# Patient Record
Sex: Female | Born: 1970 | Race: White | Hispanic: Yes | Marital: Married | Smoking: Never smoker
Health system: Southern US, Community
[De-identification: ages and names within clinical notes are randomized; demographics above are authoritative.]

## PROBLEM LIST (undated history)

## (undated) ENCOUNTER — Inpatient Hospital Stay (HOSPITAL_COMMUNITY): Payer: Self-pay

## (undated) DIAGNOSIS — R12 Heartburn: Secondary | ICD-10-CM

## (undated) DIAGNOSIS — I10 Essential (primary) hypertension: Secondary | ICD-10-CM

## (undated) DIAGNOSIS — O26899 Other specified pregnancy related conditions, unspecified trimester: Secondary | ICD-10-CM

## (undated) DIAGNOSIS — E785 Hyperlipidemia, unspecified: Secondary | ICD-10-CM

## (undated) DIAGNOSIS — D649 Anemia, unspecified: Secondary | ICD-10-CM

## (undated) HISTORY — PX: TUBAL LIGATION: SHX77

---

## 2006-10-09 ENCOUNTER — Encounter (INDEPENDENT_AMBULATORY_CARE_PROVIDER_SITE_OTHER): Payer: Self-pay | Admitting: Gynecology

## 2006-10-09 ENCOUNTER — Inpatient Hospital Stay (HOSPITAL_COMMUNITY): Admission: AD | Admit: 2006-10-09 | Discharge: 2006-10-09 | Payer: Self-pay | Admitting: Gynecology

## 2006-10-16 ENCOUNTER — Inpatient Hospital Stay (HOSPITAL_COMMUNITY): Admission: AD | Admit: 2006-10-16 | Discharge: 2006-10-16 | Payer: Self-pay | Admitting: Obstetrics & Gynecology

## 2007-08-14 ENCOUNTER — Inpatient Hospital Stay (HOSPITAL_COMMUNITY): Admission: AD | Admit: 2007-08-14 | Discharge: 2007-08-14 | Payer: Self-pay | Admitting: Obstetrics & Gynecology

## 2007-09-26 ENCOUNTER — Encounter: Admission: RE | Admit: 2007-09-26 | Discharge: 2007-12-25 | Payer: Self-pay | Admitting: Obstetrics & Gynecology

## 2007-09-26 ENCOUNTER — Ambulatory Visit: Payer: Self-pay | Admitting: *Deleted

## 2007-09-29 ENCOUNTER — Ambulatory Visit: Payer: Self-pay | Admitting: Obstetrics and Gynecology

## 2007-10-03 ENCOUNTER — Ambulatory Visit: Payer: Self-pay | Admitting: Family Medicine

## 2007-10-10 ENCOUNTER — Ambulatory Visit: Payer: Self-pay | Admitting: Obstetrics & Gynecology

## 2007-10-13 ENCOUNTER — Ambulatory Visit (HOSPITAL_COMMUNITY): Admission: RE | Admit: 2007-10-13 | Discharge: 2007-10-13 | Payer: Self-pay | Admitting: Family Medicine

## 2007-10-17 ENCOUNTER — Ambulatory Visit: Payer: Self-pay | Admitting: Obstetrics & Gynecology

## 2007-10-27 ENCOUNTER — Ambulatory Visit (HOSPITAL_COMMUNITY): Admission: RE | Admit: 2007-10-27 | Discharge: 2007-10-27 | Payer: Self-pay | Admitting: Obstetrics & Gynecology

## 2007-10-31 ENCOUNTER — Ambulatory Visit: Payer: Self-pay | Admitting: *Deleted

## 2007-11-07 ENCOUNTER — Ambulatory Visit: Payer: Self-pay | Admitting: *Deleted

## 2007-11-14 ENCOUNTER — Ambulatory Visit: Payer: Self-pay | Admitting: Family Medicine

## 2007-11-28 ENCOUNTER — Ambulatory Visit: Payer: Self-pay | Admitting: Obstetrics & Gynecology

## 2007-12-12 ENCOUNTER — Ambulatory Visit: Payer: Self-pay | Admitting: Obstetrics & Gynecology

## 2007-12-14 ENCOUNTER — Ambulatory Visit (HOSPITAL_COMMUNITY): Admission: RE | Admit: 2007-12-14 | Discharge: 2007-12-14 | Payer: Self-pay | Admitting: Family Medicine

## 2007-12-15 ENCOUNTER — Ambulatory Visit: Payer: Self-pay | Admitting: Obstetrics & Gynecology

## 2007-12-26 ENCOUNTER — Ambulatory Visit: Payer: Self-pay | Admitting: Obstetrics & Gynecology

## 2008-01-02 ENCOUNTER — Ambulatory Visit: Payer: Self-pay | Admitting: Obstetrics & Gynecology

## 2008-01-11 ENCOUNTER — Inpatient Hospital Stay (HOSPITAL_COMMUNITY): Admission: AD | Admit: 2008-01-11 | Discharge: 2008-01-11 | Payer: Self-pay | Admitting: Obstetrics & Gynecology

## 2008-01-16 ENCOUNTER — Ambulatory Visit (HOSPITAL_COMMUNITY): Admission: RE | Admit: 2008-01-16 | Discharge: 2008-01-16 | Payer: Self-pay | Admitting: Obstetrics and Gynecology

## 2008-01-16 ENCOUNTER — Ambulatory Visit: Payer: Self-pay | Admitting: Obstetrics & Gynecology

## 2008-01-23 ENCOUNTER — Ambulatory Visit: Payer: Self-pay | Admitting: Obstetrics & Gynecology

## 2008-01-26 ENCOUNTER — Ambulatory Visit: Payer: Self-pay | Admitting: Obstetrics & Gynecology

## 2008-01-30 ENCOUNTER — Ambulatory Visit: Payer: Self-pay | Admitting: Family Medicine

## 2008-02-02 ENCOUNTER — Ambulatory Visit: Payer: Self-pay | Admitting: Obstetrics & Gynecology

## 2008-02-02 ENCOUNTER — Ambulatory Visit (HOSPITAL_COMMUNITY): Admission: RE | Admit: 2008-02-02 | Discharge: 2008-02-02 | Payer: Self-pay | Admitting: Obstetrics and Gynecology

## 2008-02-06 ENCOUNTER — Ambulatory Visit: Payer: Self-pay | Admitting: Obstetrics & Gynecology

## 2008-02-06 ENCOUNTER — Ambulatory Visit (HOSPITAL_COMMUNITY): Admission: RE | Admit: 2008-02-06 | Discharge: 2008-02-06 | Payer: Self-pay | Admitting: Obstetrics & Gynecology

## 2008-02-09 ENCOUNTER — Ambulatory Visit: Payer: Self-pay | Admitting: Family Medicine

## 2008-02-13 ENCOUNTER — Ambulatory Visit: Payer: Self-pay | Admitting: Obstetrics & Gynecology

## 2008-02-13 ENCOUNTER — Encounter: Payer: Self-pay | Admitting: Obstetrics & Gynecology

## 2008-02-15 ENCOUNTER — Ambulatory Visit: Payer: Self-pay | Admitting: Obstetrics and Gynecology

## 2008-02-15 ENCOUNTER — Inpatient Hospital Stay (HOSPITAL_COMMUNITY): Admission: AD | Admit: 2008-02-15 | Discharge: 2008-02-19 | Payer: Self-pay | Admitting: Family Medicine

## 2008-02-16 ENCOUNTER — Encounter: Payer: Self-pay | Admitting: Obstetrics and Gynecology

## 2008-04-13 LAB — CONVERTED CEMR LAB

## 2008-07-12 ENCOUNTER — Ambulatory Visit: Payer: Self-pay | Admitting: Nurse Practitioner

## 2008-07-12 DIAGNOSIS — I10 Essential (primary) hypertension: Secondary | ICD-10-CM | POA: Insufficient documentation

## 2008-07-12 DIAGNOSIS — E119 Type 2 diabetes mellitus without complications: Secondary | ICD-10-CM

## 2008-07-12 DIAGNOSIS — E1100 Type 2 diabetes mellitus with hyperosmolarity without nonketotic hyperglycemic-hyperosmolar coma (NKHHC): Secondary | ICD-10-CM | POA: Insufficient documentation

## 2008-07-12 LAB — CONVERTED CEMR LAB
Bilirubin Urine: NEGATIVE
Hgb A1c MFr Bld: 7.8 %
Nitrite: NEGATIVE
Specific Gravity, Urine: >=1.03
WBC Urine, dipstick: NEGATIVE

## 2008-07-19 ENCOUNTER — Ambulatory Visit: Payer: Self-pay | Admitting: *Deleted

## 2008-08-09 ENCOUNTER — Ambulatory Visit: Payer: Self-pay | Admitting: Nurse Practitioner

## 2008-08-09 LAB — CONVERTED CEMR LAB
Bilirubin Urine: NEGATIVE
Ketones, urine, test strip: NEGATIVE
Specific Gravity, Urine: 1.015

## 2008-08-10 ENCOUNTER — Encounter (INDEPENDENT_AMBULATORY_CARE_PROVIDER_SITE_OTHER): Payer: Self-pay | Admitting: Nurse Practitioner

## 2008-08-10 LAB — CONVERTED CEMR LAB
ALT: 11 U/L (ref 0–35)
AST: 11 U/L (ref 0–37)
Albumin: 3.9 g/dL (ref 3.5–5.2)
Alkaline Phosphatase: 54 U/L (ref 39–117)
BUN: 13 mg/dL (ref 6–23)
Basophils Absolute: 0 K/uL (ref 0.0–0.1)
Basophils Relative: 1 % (ref 0–1)
CO2: 23 meq/L (ref 19–32)
Calcium: 8.5 mg/dL (ref 8.4–10.5)
Chloride: 105 meq/L (ref 96–112)
Cholesterol: 169 mg/dL (ref 0–200)
Creatinine, Ser: 0.68 mg/dL (ref 0.40–1.20)
Eosinophils Absolute: 0.8 K/uL — ABNORMAL HIGH (ref 0.0–0.7)
Eosinophils Relative: 9 % — ABNORMAL HIGH (ref 0–5)
Glucose, Bld: 105 mg/dL — ABNORMAL HIGH (ref 70–99)
HCT: 43.3 % (ref 36.0–46.0)
HDL: 54 mg/dL (ref >39)
Hemoglobin: 14 g/dL (ref 12.0–15.0)
LDL Cholesterol: 94 mg/dL (ref 0–99)
Lymphocytes Relative: 31 % (ref 12–46)
Lymphs Abs: 2.7 K/uL (ref 0.7–4.0)
MCHC: 32.3 g/dL (ref 30.0–36.0)
MCV: 91.9 fL (ref 78.0–100.0)
Microalb, Ur: 0.84 mg/dL (ref 0.00–1.89)
Monocytes Absolute: 0.3 K/uL (ref 0.1–1.0)
Monocytes Relative: 3 % (ref 3–12)
Neutro Abs: 4.9 K/uL (ref 1.7–7.7)
Neutrophils Relative %: 57 % (ref 43–77)
Platelets: 303 K/uL (ref 150–400)
Potassium: 4.8 meq/L (ref 3.5–5.3)
RBC: 4.71 M/uL (ref 3.87–5.11)
RDW: 12.8 % (ref 11.5–15.5)
Sodium: 138 meq/L (ref 135–145)
TSH: 0.815 u[IU]/mL (ref 0.350–4.500)
Total Bilirubin: 0.5 mg/dL (ref 0.3–1.2)
Total CHOL/HDL Ratio: 3.1
Total Protein: 7.6 g/dL (ref 6.0–8.3)
Triglycerides: 106 mg/dL (ref <150)
VLDL: 21 mg/dL (ref 0–40)
WBC: 8.7 10*3/microliter (ref 4.0–10.5)

## 2008-10-10 ENCOUNTER — Ambulatory Visit: Payer: Self-pay | Admitting: Nurse Practitioner

## 2008-10-10 DIAGNOSIS — E7800 Pure hypercholesterolemia, unspecified: Secondary | ICD-10-CM | POA: Insufficient documentation

## 2008-10-10 LAB — CONVERTED CEMR LAB
Blood Glucose, Fingerstick: 126
LDL Goal: 100 mg/dL

## 2008-10-31 ENCOUNTER — Ambulatory Visit: Payer: Self-pay | Admitting: Nurse Practitioner

## 2008-10-31 LAB — CONVERTED CEMR LAB
HDL: 54 mg/dL (ref >39)
Total CHOL/HDL Ratio: 2.7

## 2008-11-05 ENCOUNTER — Encounter (INDEPENDENT_AMBULATORY_CARE_PROVIDER_SITE_OTHER): Payer: Self-pay | Admitting: Nurse Practitioner

## 2009-01-10 ENCOUNTER — Ambulatory Visit: Payer: Self-pay | Admitting: Internal Medicine

## 2009-01-10 ENCOUNTER — Ambulatory Visit: Payer: Self-pay | Admitting: Nurse Practitioner

## 2009-01-10 LAB — CONVERTED CEMR LAB
Blood Glucose, Fingerstick: 111
Hgb A1c MFr Bld: 6.7 %

## 2009-02-04 ENCOUNTER — Encounter (INDEPENDENT_AMBULATORY_CARE_PROVIDER_SITE_OTHER): Payer: Self-pay | Admitting: Nurse Practitioner

## 2009-06-06 ENCOUNTER — Ambulatory Visit: Payer: Self-pay | Admitting: Nurse Practitioner

## 2009-06-06 LAB — CONVERTED CEMR LAB: Blood Glucose, Fingerstick: 135

## 2009-07-31 ENCOUNTER — Ambulatory Visit: Payer: Self-pay | Admitting: Nurse Practitioner

## 2009-07-31 DIAGNOSIS — K644 Residual hemorrhoidal skin tags: Secondary | ICD-10-CM | POA: Insufficient documentation

## 2009-07-31 LAB — CONVERTED CEMR LAB
ALT: 11 units/L (ref 0–35)
Albumin: 4 g/dL (ref 3.5–5.2)
Basophils Relative: 0 % (ref 0–1)
Bilirubin Urine: NEGATIVE
CO2: 22 meq/L (ref 19–32)
Casts: NONE SEEN /lpf
Chlamydia, DNA Probe: NEGATIVE
Chloride: 104 meq/L (ref 96–112)
Crystals: NONE SEEN
Eosinophils Absolute: 0.7 10*3/uL (ref 0.0–0.7)
Eosinophils Relative: 8 % — ABNORMAL HIGH (ref 0–5)
GC Probe Amp, Genital: NEGATIVE
Glucose, Urine, Semiquant: NEGATIVE
HCT: 41.4 % (ref 36.0–46.0)
Hemoglobin: 13.2 g/dL (ref 12.0–15.0)
Ketones, urine, test strip: NEGATIVE
Lymphs Abs: 2.7 10*3/uL (ref 0.7–4.0)
MCHC: 31.9 g/dL (ref 30.0–36.0)
MCV: 90.8 fL (ref 78.0–100.0)
Nitrite: NEGATIVE
RBC: 4.56 M/uL (ref 3.87–5.11)
RDW: 13.9 % (ref 11.5–15.5)
Sodium: 137 meq/L (ref 135–145)
Specific Gravity, Urine: 1.01
TSH: 1.1 microintl units/mL (ref 0.350–4.500)
WBC Urine, dipstick: NEGATIVE

## 2009-08-01 ENCOUNTER — Encounter (INDEPENDENT_AMBULATORY_CARE_PROVIDER_SITE_OTHER): Payer: Self-pay | Admitting: Nurse Practitioner

## 2009-08-19 ENCOUNTER — Encounter (INDEPENDENT_AMBULATORY_CARE_PROVIDER_SITE_OTHER): Payer: Self-pay | Admitting: Nurse Practitioner

## 2009-10-24 ENCOUNTER — Ambulatory Visit: Payer: Self-pay | Admitting: Nurse Practitioner

## 2009-10-28 ENCOUNTER — Encounter (INDEPENDENT_AMBULATORY_CARE_PROVIDER_SITE_OTHER): Payer: Self-pay | Admitting: Nurse Practitioner

## 2009-11-27 ENCOUNTER — Ambulatory Visit: Payer: Self-pay | Admitting: Nurse Practitioner

## 2009-11-27 DIAGNOSIS — R05 Cough: Secondary | ICD-10-CM

## 2009-11-27 LAB — CONVERTED CEMR LAB: Blood Glucose, Fingerstick: 216

## 2009-12-04 ENCOUNTER — Ambulatory Visit: Payer: Self-pay | Admitting: Nurse Practitioner

## 2009-12-04 DIAGNOSIS — K219 Gastro-esophageal reflux disease without esophagitis: Secondary | ICD-10-CM

## 2009-12-04 DIAGNOSIS — E669 Obesity, unspecified: Secondary | ICD-10-CM | POA: Insufficient documentation

## 2009-12-25 ENCOUNTER — Ambulatory Visit: Payer: Self-pay | Admitting: Nurse Practitioner

## 2009-12-25 DIAGNOSIS — J309 Allergic rhinitis, unspecified: Secondary | ICD-10-CM | POA: Insufficient documentation

## 2009-12-25 LAB — CONVERTED CEMR LAB: Blood Glucose, Fingerstick: 131

## 2010-01-06 ENCOUNTER — Encounter (INDEPENDENT_AMBULATORY_CARE_PROVIDER_SITE_OTHER): Payer: Self-pay | Admitting: *Deleted

## 2010-03-26 ENCOUNTER — Encounter (INDEPENDENT_AMBULATORY_CARE_PROVIDER_SITE_OTHER): Payer: Self-pay | Admitting: Nurse Practitioner

## 2010-03-26 ENCOUNTER — Ambulatory Visit: Payer: Self-pay | Admitting: Nurse Practitioner

## 2010-03-26 DIAGNOSIS — J00 Acute nasopharyngitis [common cold]: Secondary | ICD-10-CM

## 2010-03-26 LAB — CONVERTED CEMR LAB: Blood Glucose, Fingerstick: 145

## 2010-03-27 LAB — CONVERTED CEMR LAB: Hgb A1c MFr Bld: 7.5 % — ABNORMAL HIGH (ref <5.7)

## 2010-05-04 ENCOUNTER — Encounter: Payer: Self-pay | Admitting: *Deleted

## 2010-05-14 NOTE — Assessment & Plan Note (Signed)
Summary: Complete Physical Exam   Vital Signs:  Patient profile:   40 year old female Menstrual status:  regular LMP:     07/03/2009 Weight:      214.3 pounds BMI:     38.10 BSA:     1.99 Temp:     97.9 degrees F oral Pulse rate:   77 / minute Pulse rhythm:   regular Resp:     20 per minute BP sitting:   118 / 80  (left arm) Cuff size:   large  Vitals Entered By: Levon Hedger (July 31, 2009 10:21 AM) CC: CPP Is Patient Diabetic? Yes Pain Assessment Patient in pain? no      CBG Result 109 CBG Device ID B  Does patient need assistance? Functional Status Self care Ambulation Normal LMP (date): 07/03/2009 LMP - Character: normal     Menstrual Status regular Enter LMP: 07/03/2009 Last PAP Result done at [facility]   CC:  CPP.  History of Present Illness:  Pt into the office for complete physcial exam  PAP - all previous pap smears have been normal no family history of cervical or ovarian cancer 3 children - 1 C-section, last child is 10  and others are ages 20 and 76 Pt is not married but has been in the same relationship for the past 20 years Menses - monthly due to oral contraception but she has not taken it in 1 month because her menses came on when it was time for her CPe and she completed her supply  Mammogram - Never had a mammogram  no self breast exams at home No family history of breast cancer  Optho - "I am supposed to wear glasses"  during her last exam she was given an Rx but she was never able to get the glasses.  Pt is also diabetic and knows she should get yearly eye exams  Dental - no recent dental exam. last 2-3 years ago  Diabetes Management History:      The patient is a 40 years old female who comes in for evaluation of Type 2 Diabetes Mellitus.  She has not been enrolled in the "Diabetic Education Program".  She is checking home blood sugars.  She says that she is exercising.    Habits & Providers  Alcohol-Tobacco-Diet     Alcohol  drinks/day: 0     Tobacco Status: never  Exercise-Depression-Behavior     Does Patient Exercise: yes     Exercise Counseling: to improve exercise regimen     Have you felt down or hopeless? no     Have you felt little pleasure in things? no     Depression Counseling: not indicated; screening negative for depression     Drug Use: never  Comments: PHQ- 9 score = 2  Allergies (verified): No Known Drug Allergies  Review of Systems General:  Denies fever. Eyes:  Denies blurring. ENT:  Denies earache. CV:  Denies chest pain or discomfort. Resp:  Denies cough. GI:  Denies abdominal pain, nausea, and vomiting. GU:  Denies discharge. MS:  Denies joint pain. Derm:  Denies rash. Neuro:  Denies headaches. Psych:  Denies depression.  Physical Exam  General:  alert.   Head:  normocephalic.   Eyes:  pupils equal, pupils round, and pupils reactive to light.   Ears:  bil TM with moderate cerumen Nose:  no nasal discharge.   Mouth:  pharynx pink and moist and fair dentition.   Neck:  supple.   Chest  Wall:  no mass.   Breasts:  skin/areolae normal, no masses, and no abnormal thickening.   Lungs:  normal breath sounds.   Heart:  normal rate and regular rhythm.   Abdomen:  soft, non-tender, and no distention.   Rectal:  external hemorrhoid(s).   Msk:  normal ROM.   Extremities:  no edema Neurologic:  alert & oriented X3.    Pelvic Exam  Vulva:      normal appearance.   Urethra and Bladder:      Urethra--no discharge.  Bladder--normal.   Vagina:      yellow discharge Cervix:      parous, friable.  right deviation Uterus:      smooth.   Adnexa:      nontender bilaterally.   Rectum:      + external hemorrhoids.      Impression & Recommendations:  Problem # 1:  ROUTINE GYNECOLOGICAL EXAMINATION (ICD-V72.31) labs done except lipids as pt is not fasting EKG done rec optho and dental exam foot care placcard given self breast exam placcard given PHQ-9 score =  2 Orders: UA Dipstick w/o Micro (manual) (16109) KOH/ WET Mount [phone]) Pap Smear, Thin Prep ( Collection of) (Q0091) T- GC Chlamydia (09811)  Problem # 2:  HYPERCHOLESTEROLEMIA (ICD-272.0) labs done today - pt is not fasting Her updated medication list for this problem includes:    Pravastatin Sodium 10 Mg Tabs (Pravastatin sodium) .Marland Kitchen... 1 tablet by mouth nightly for cholesterol  Problem # 3:  FAMILY PLANNING (ICD-V25.09) urine pregnancy negative will refill oral contraception  Complete Medication List: 1)  Prenatal Multivitamin-ultra Tabs (Prenatal vit-dss-fe cbn-fa) .Marland Kitchen.. 1 tablet by mouth daily 2)  Glucophage 500 Mg Tabs (Metformin hcl) .Marland Kitchen.. 1 tablet by mouth two times a day for diabetes 3)  Cyclessa Tabs (Desogestrel-ethinyl estradiol) .... One tablet by mouth daily 4)  Prodigy Blood Glucose Monitor Devi (Blood glucose monitoring suppl) .... Dispense meter to check blood sugar dx - 250.02 5)  Prodigy Blood Glucose Test Strp (Glucose blood) .... Use to check blood sugar once daily dx = 250.02 6)  Prodigy Twist Top Lancets 28g Misc (Lancets) .... Use to check blood sugar daily before breakfast 7)  Pravastatin Sodium 10 Mg Tabs (Pravastatin sodium) .Marland Kitchen.. 1 tablet by mouth nightly for cholesterol 8)  Lisinopril 5 Mg Tabs (Lisinopril) .... One tablet by mouth daily for blood pressure  Other Orders: EKG w/ Interpretation (93000) T-Comprehensive Metabolic Panel (91478-29562) T-CBC w/Diff (13086-57846) Rapid HIV  (96295) T-TSH (28413-24401) Capillary Blood Glucose/CBG (02725) T-Urine Microalbumin w/creat. ratio [phone])  Diabetes Management Assessment/Plan:      The following lipid goals have been established for the patient: Total cholesterol goal of 200; LDL cholesterol goal of 100; HDL cholesterol goal of 40; Triglyceride goal of 150.  Her blood pressure goal is < 130/80.    Patient Instructions: 1)  You will be informed of any abnormal lab results. 2)  Birth control -  start the Sunday following your next menstrual cycle 3)  Follow up in 4 months for diabetes - August 2011 4)  Come fasting for cholesterol lab.  You may only drink water before this visit Prescriptions: CYCLESSA  TABS (DESOGESTREL-ETHINYL ESTRADIOL) One tablet by mouth daily  #1 package x 11   Entered and Authorized by:   Lehman Prom FNP   Signed by:   Lehman Prom FNP on 07/31/2009   Method used:   Print then Give to Patient   RxID:   [id number]   Prevention &  Chronic Care Immunizations   Influenza vaccine: Fluvax 3+  (01/10/2009)    Tetanus booster: 06/06/2009: Tdap    Pneumococcal vaccine: Pneumovax  (08/09/2008)  Other Screening   Pap smear: done at [facility]  (04/13/2008)   Pap smear action/deferral: Ordered  (07/31/2009)   Pap smear due: 08/01/2010   Smoking status: never  (07/31/2009)  Diabetes Mellitus   HgbA1C: 6.8  (06/06/2009)   Hemoglobin A1C due: 09/03/2009    Eye exam: Diabetic eye changes  (01/10/2009)    Foot exam: yes  (06/06/2009)   Foot exam action/deferral: Do today   High risk foot: No  (01/10/2009)   Foot care education: Done  (08/09/2008)    Urine microalbumin/creatinine ratio: Not documented   Urine microalbumin action/deferral: Ordered   Urine microalbumin/cr due: 08/01/2010  Lipids   Total Cholesterol: 147  (10/31/2008)   Lipid panel action/deferral: Deferred   LDL: 76  (10/31/2008)   LDL Direct: Not documented   HDL: 54  (10/31/2008)   Triglycerides: 84  (10/31/2008)    SGOT (AST): 11  (10/31/2008)   SGPT (ALT): 11  (10/31/2008) CMP ordered    Alkaline phosphatase: 53  (10/31/2008)   Total bilirubin: 0.3  (10/31/2008)  Hypertension   Last Blood Pressure: 118 / 80  (07/31/2009)   Serum creatinine: 0.68  (08/09/2008)   Serum potassium 4.8  (08/09/2008) CMP ordered   Self-Management Support :    Diabetes self-management support: Not documented    Hypertension self-management support: Not documented    Lipid  self-management support: Not documented    Nursing Instructions: CBG today (see order)     EKG  Procedure date:  07/31/2009  Findings:      NSR   Laboratory Results   Urine Tests  Date/Time Received: July 31, 2009 11:48 AM   Routine Urinalysis   Color: dark yellow Glucose: negative   (Normal Range: Negative) Bilirubin: negative   (Normal Range: Negative) Ketone: negative   (Normal Range: Negative) Spec. Gravity: 1.010   (Normal Range: 1.003-1.035) Blood: trace-lysed   (Normal Range: Negative) pH: 6.0   (Normal Range: 5.0-8.0) Protein: negative   (Normal Range: Negative) Urobilinogen: 0.2   (Normal Range: 0-1) Nitrite: negative   (Normal Range: Negative) Leukocyte Esterace: negative   (Normal Range: Negative)    Urine HCG: negative  Blood Tests     CBG Random:: 109mg /dL  Date/Time Received: July 31, 2009 11:18 AM   Wet Mount Source: vagina WBC/hpf: 5-10 Bacteria/hpf: 1+ Clue cells/hpf: none Yeast/hpf: none Wet Mount KOH: Negative Trichomonas/hpf: none  Other Tests  Rapid HIV: negative

## 2010-05-14 NOTE — Progress Notes (Signed)
Summary: Office Visit/DEPRESSION SCREENING  Office Visit/DEPRESSION SCREENING   Imported By: Arta Bruce 08/01/2009 15:34:40  _____________________________________________________________________  External Attachment:    Type:   Image     Comment:   External Document

## 2010-05-14 NOTE — Assessment & Plan Note (Signed)
Summary: Diabetes   Vital Signs:  Patient profile:   40 year old female Weight:      213.9 pounds BMI:     38.03 BSA:     1.99 Temp:     98.1 degrees F oral Pulse rate:   73 / minute Pulse rhythm:   regular Resp:     16 per minute BP sitting:   137 / 85  (left arm) Cuff size:   large  Vitals Entered By: Levon Hedger (June 06, 2009 9:09 AM)  CC: follow-up visit DM, Lipid Management Is Patient Diabetic? Yes Pain Assessment Patient in pain? no      CBG Result 135 CBG Device ID A  Does patient need assistance? Functional Status Self care Ambulation Normal   CC:  follow-up visit DM and Lipid Management.  History of Present Illness:  Pt into the office for diabetes. Checks blood sugar at home but it fluates due to her work schedule.    Tetanus - ? last given in 2000.    No medications present today in office.  Advised pt to bring all her meds to the office visit.  Diabetes Management History:      The patient is a 40 years old female who comes in for evaluation of Type 2 Diabetes Mellitus.  She has not been enrolled in the "Diabetic Education Program".  She states understanding of dietary principles and is following her diet appropriately.  No sensory loss is reported.  Self foot exams are not being performed.  She is checking home blood sugars.  She says that she is exercising.        Hypoglycemic symptoms are not occurring.  No hyperglycemic symptoms are reported.        There are no symptoms to suggest diabetic complications.  No changes have been made to her treatment plan since last visit.    Lipid Management History:      Positive NCEP/ATP III risk factors include diabetes and hypertension.  Negative NCEP/ATP III risk factors include female age less than 39 years old, no history of early menopause without estrogen hormone replacement, non-tobacco-user status, no ASHD (atherosclerotic heart disease), no prior stroke/TIA, no peripheral vascular disease, and no  history of aortic aneurysm.        The patient states that she does not know about the "Therapeutic Lifestyle Change" diet.  The patient does not know about adjunctive measures for cholesterol lowering.  She expresses no side effects from her lipid-lowering medication.  The patient denies any symptoms to suggest myopathy or liver disease.      Allergies (verified): No Known Drug Allergies  Review of Systems General:  Denies fever. CV:  Denies chest pain or discomfort.  Physical Exam  General:  alert.   Head:  normocephalic.   Lungs:  normal breath sounds.   Heart:  normal rate and regular rhythm.   Abdomen:  normal bowel sounds.   Msk:  normal ROM.   Neurologic:  alert & oriented X3.   Skin:  color normal.   Psych:  Oriented X3.    Diabetes Management Exam:    Foot Exam (with socks and/or shoes not present):       Sensory-Monofilament:          Left foot: normal          Right foot: normal       Inspection:          Left foot: abnormal  Comments: plantar surface of great toe with small callous          Right foot: normal       Nails:          Left foot: normal          Right foot: normal   Impression & Recommendations:  Problem # 1:  DIABETES MELLITUS (ICD-250.00) stable continue current meds. Her updated medication list for this problem includes:    Glucophage 500 Mg Tabs (Metformin hcl) .Marland Kitchen... 1 tablet by mouth two times a day for diabetes    Lisinopril 5 Mg Tabs (Lisinopril) ..... One tablet by mouth daily for blood pressure  Orders: Capillary Blood Glucose/CBG (82948) Hemoglobin A1C (83036)  Problem # 2:  HYPERCHOLESTEROLEMIA (ICD-272.0) Assessment: Improved continue current meds will check labs on next visit Her updated medication list for this problem includes:    Pravastatin Sodium 10 Mg Tabs (Pravastatin sodium) .Marland Kitchen... 1 tablet by mouth nightly for cholesterol  Problem # 3:  HYPERTENSION, BENIGN ESSENTIAL (ICD-401.1) DASH diet start  ACE Her updated medication list for this problem includes:    Lisinopril 5 Mg Tabs (Lisinopril) ..... One tablet by mouth daily for blood pressure will give tdap today  Complete Medication List: 1)  Prenatal Multivitamin-ultra Tabs (Prenatal vit-dss-fe cbn-fa) .Marland Kitchen.. 1 tablet by mouth daily 2)  Glucophage 500 Mg Tabs (Metformin hcl) .Marland Kitchen.. 1 tablet by mouth two times a day for diabetes 3)  Cyclessa Tabs (Desogestrel-ethinyl estradiol) .... Rx by gyn 4)  Prodigy Blood Glucose Monitor Devi (Blood glucose monitoring suppl) .... Dispense meter to check blood sugar dx - 250.02 5)  Prodigy Blood Glucose Test Strp (Glucose blood) .... Use to check blood sugar once daily dx = 250.02 6)  Prodigy Twist Top Lancets 28g Misc (Lancets) .... Use to check blood sugar daily before breakfast 7)  Pravastatin Sodium 10 Mg Tabs (Pravastatin sodium) .Marland Kitchen.. 1 tablet by mouth nightly for cholesterol 8)  Lisinopril 5 Mg Tabs (Lisinopril) .... One tablet by mouth daily for blood pressure  Other Orders: Tdap => 60yrs IM [phone]) Admin 1st Vaccine (60454) Admin 1st Vaccine [facility] [place]) [phone])  Diabetes Management Assessment/Plan:      The following lipid goals have been established for the patient: Total cholesterol goal of 200; LDL cholesterol goal of 100; HDL cholesterol goal of 40; Triglyceride goal of 150.  Her blood pressure goal is < 130/80.    Lipid Assessment/Plan:      Based on NCEP/ATP III, the patient's risk factor category is "history of diabetes".  The patient's lipid goals are as follows: Total cholesterol goal is 200; LDL cholesterol goal is 100; HDL cholesterol goal is 40; Triglyceride goal is 150.    Patient Instructions: 1)  Schedule your next appointment for a complete physical exam. 2)  You will need to be fasting for labs. 3)  Check lisinopril 4)  You have been given the tetanus today. It is good for 10 years. 5)  Diabetes is doing well.  Your Hbga1c = 6.8   6)  Continue current medications. 7)   Blood pressure - slightly elevated.  Start lisinopril 5mg  by mouth daily. Prescriptions: GLUCOPHAGE 500 MG TABS (METFORMIN HCL) 1 tablet by mouth two times a day for diabetes  #60 x 5   Entered and Authorized by:   Lehman Prom FNP   Signed by:   Lehman Prom FNP on 06/06/2009   Method used:   Print then Give to Patient   RxID:   [id number]  LISINOPRIL 5 MG TABS (LISINOPRIL) One tablet by mouth daily for blood pressure  #30 x 5   Entered and Authorized by:   Lehman Prom FNP   Signed by:   Lehman Prom FNP on 06/06/2009   Method used:   Print then Give to Patient   RxID:   [id number]    Last LDL:                                                 76 (10/31/2008 10:29:00 PM)        Diabetic Foot Exam    10-g (5.07) Semmes-Weinstein Monofilament Test Performed by: Levon Hedger          Right Foot          Left Foot Visual Inspection               Test Control      normal         normal Site 1         normal         normal Site 2         normal         normal Site 3         normal         normal Site 4         normal         normal Site 5         normal         normal Site 6         normal         normal Site 7         normal         normal Site 8         normal         normal Site 9         normal         normal Site 10         normal         normal  Impression      normal         normal   Laboratory Results   Blood Tests   Date/Time Received: June 06, 2009 9:23 AM   HGBA1C: 6.8%   (Normal Range: Non-Diabetic - 3-6%   Control Diabetic - 6-8%) CBG Random:: 135      Laboratory Results   Blood Tests     HGBA1C: 6.8%   (Normal Range: Non-Diabetic - 3-6%   Control Diabetic - 6-8%) CBG Random:: 135mg /dL      Tetanus/Td Vaccine    Vaccine Type: Tdap    Site: left deltoid    Mfr: [employer] Pasteur    Dose: 0.5 ml    Route: IM    Given by: Levon Hedger    Exp. Date: 07/19/2011    Lot #: [id number]    VIS given: 03/01/07 version  given June 06, 2009.

## 2010-05-14 NOTE — Assessment & Plan Note (Signed)
Summary: Acute - Cough   Vital Signs:  Patient profile:   40 year old female Menstrual status:  regular Height:      63 inches Weight:      224 pounds BMI:     39.82 Temp:     98.0 degrees F oral Pulse rate:   78 / minute Pulse rhythm:   regular Resp:     18 per minute BP sitting:   112 / 82  (left arm) Cuff size:   large  Vitals Entered By: [place] Shannon (November 27, 2009 11:00 AM)  Nutrition Counseling: Patient's BMI is greater than 25 and therefore counseled on weight management options. CC: pt says she started with a cough four months ago.... pt says she has took OTC meds but nothing is helps...Marland KitchenMarland Kitchen pt says she vomit because she coughs so much and the vomit is thick mucus/phelm...., Hypertension Management, Lipid Management Is Patient Diabetic? Yes Pain Assessment Patient in pain? no      CBG Result 216  Does patient need assistance? Functional Status Self care Ambulation Normal   CC:  pt says she started with a cough four months ago.... pt says she has took OTC meds but nothing is helps...Marland KitchenMarland Kitchen pt says she vomit because she coughs so much and the vomit is thick mucus/phelm...., Hypertension Management, and Lipid Management.  History of Present Illness:  Pt into the office with complaints of cough When pt was in office in April the cough was present and she took some OTC medications which helped for about a week.  Then symptoms returned and cough has been ongoing for at least the last 3 months. -nausea +vomiting but due to heavy coughing. She has taking multiple over the counter medications without resolution of symptoms. -fever She was started on ACE in February and cough started in March 2011. She usually takes the lisinopril at night  No medications today with pt but she is able to recall meds - she was advised to bring all medications into this office  Hypertension History:      She denies headache, chest pain, and palpitations.  She notes no problems with any  antihypertensive medication side effects.  pt is still taking lisinopril as ordered.        Positive major cardiovascular risk factors include diabetes, hyperlipidemia, and hypertension.  Negative major cardiovascular risk factors include female age less than 3 years old and non-tobacco-user status.        Further assessment for target organ damage reveals no history of ASHD, cardiac end-organ damage (CHF/LVH), stroke/TIA, peripheral vascular disease, renal insufficiency, or hypertensive retinopathy.    Lipid Management History:      Positive NCEP/ATP III risk factors include diabetes and hypertension.  Negative NCEP/ATP III risk factors include female age less than 78 years old, no history of early menopause without estrogen hormone replacement, non-tobacco-user status, no ASHD (atherosclerotic heart disease), no prior stroke/TIA, no peripheral vascular disease, and no history of aortic aneurysm.        The patient states that she does not know about the "Therapeutic Lifestyle Change" diet.  The patient does not know about adjunctive measures for cholesterol lowering.  She expresses no side effects from her lipid-lowering medication.  The patient denies any symptoms to suggest myopathy or liver disease.     Current Medications (verified): 1)  Prenatal Multivitamin-Ultra  Tabs (Prenatal Vit-Dss-Fe Cbn-Fa) .Marland Kitchen.. 1 Tablet By Mouth Daily 2)  Glucophage 500 Mg Tabs (Metformin Hcl) .Marland Kitchen.. 1 Tablet By Mouth Two  Times A Day For Diabetes 3)  Cyclessa  Tabs (Desogestrel-Ethinyl Estradiol) .... One Tablet By Mouth Daily 4)  Prodigy Blood Glucose Monitor  Devi (Blood Glucose Monitoring Suppl) .... Dispense Meter To Check Blood Sugar Dx - 250.02 5)  Prodigy Blood Glucose Test  Strp (Glucose Blood) .... Use To Check Blood Sugar Once Daily Dx = 250.02 6)  Prodigy Twist Top Lancets 28g  Misc (Lancets) .... Use To Check Blood Sugar Daily Before Breakfast 7)  Pravastatin Sodium 10 Mg Tabs (Pravastatin Sodium) .Marland Kitchen.. 1  Tablet By Mouth Nightly For Cholesterol 8)  Lisinopril 5 Mg Tabs (Lisinopril) .... One Tablet By Mouth Daily For Blood Pressure  Allergies (verified): No Known Drug Allergies  Review of Systems General:  Denies fever. CV:  Denies chest pain or discomfort. Resp:  Complains of cough. GI:  Denies abdominal pain, nausea, and vomiting. GU:  Denies discharge.  Physical Exam  General:  alert.   Head:  normocephalic.   Lungs:  normal breath sounds.   Heart:  normal rate and regular rhythm.   Msk:  up to the exam  Neurologic:  alert & oriented X3.   Skin:  color normal.   Psych:  Oriented X3.     Impression & Recommendations:  Problem # 1:  COUGH (ICD-786.2) likely due to ACE  will start on cozaar  Problem # 2:  HYPERTENSION, BENIGN ESSENTIAL (ICD-401.1) Bp is stable but will need to change meds will recheck BP in 1 month The following medications were removed from the medication list:    Lisinopril 5 Mg Tabs (Lisinopril) ..... One tablet by mouth daily for blood pressure Her updated medication list for this problem includes:    Cozaar 25 Mg Tabs (Losartan potassium) ..... One tablet by mouth daily for blood pressure **pharmacy - d/c lisinopril**  Complete Medication List: 1)  Prenatal Multivitamin-ultra Tabs (Prenatal vit-dss-fe cbn-fa) .Marland Kitchen.. 1 tablet by mouth daily 2)  Glucophage 500 Mg Tabs (Metformin hcl) .Marland Kitchen.. 1 tablet by mouth two times a day for diabetes 3)  Cyclessa Tabs (Desogestrel-ethinyl estradiol) .... One tablet by mouth daily 4)  Prodigy Blood Glucose Monitor Devi (Blood glucose monitoring suppl) .... Dispense meter to check blood sugar dx - 250.02 5)  Prodigy Blood Glucose Test Strp (Glucose blood) .... Use to check blood sugar once daily dx = 250.02 6)  Prodigy Twist Top Lancets 28g Misc (Lancets) .... Use to check blood sugar daily before breakfast 7)  Pravastatin Sodium 10 Mg Tabs (Pravastatin sodium) .Marland Kitchen.. 1 tablet by mouth nightly for cholesterol 8)  Cozaar 25  Mg Tabs (Losartan potassium) .... One tablet by mouth daily for blood pressure **pharmacy - d/c lisinopril**  Hypertension Assessment/Plan:      The patient's hypertensive risk group is category C: Target organ damage and/or diabetes.  Her calculated 10 year risk of coronary heart disease is 2 %.  Today's blood pressure is 112/82.  Her blood pressure goal is < 130/80.  Lipid Assessment/Plan:      Based on NCEP/ATP III, the patient's risk factor category is "history of diabetes".  The patient's lipid goals are as follows: Total cholesterol goal is 200; LDL cholesterol goal is 100; HDL cholesterol goal is 40; Triglyceride goal is 150.     Patient Instructions: 1)  Cough may be caused by lisinopril.  Stop taking this medications. 2)  Start cozaar (losartan) 25mg  by mouth daily.  This medication will be sent electronically to [facility] pharmacy.  Check there next week to see if  it is available. 3)  Follow up with n.martin,fnp in 4 weeks for cough. 4)  Will ned cbg, hgba1c Prescriptions: COZAAR 25 MG TABS (LOSARTAN POTASSIUM) One tablet by mouth daily for blood pressure **pharmacy - d/c lisinopril**  #30 x 5   Entered and Authorized by:   Lehman Prom FNP   Signed by:   Lehman Prom FNP on 11/27/2009   Method used:   Faxed to ...       [facility] (retail)       [address] [place], [place]  [address]       Ph: [phone]       Fax: [phone]   RxID:   [id number]

## 2010-05-14 NOTE — Assessment & Plan Note (Signed)
Summary: Cough/Acid Reflux   Vital Signs:  Patient profile:   40 year old female Menstrual status:  regular LMP:     11/2009 Weight:      224.6 pounds BMI:     39.93 Temp:     98.0 degrees F oral Pulse rate:   72 / minute Pulse rhythm:   regular Resp:     20 per minute BP sitting:   120 / 76  (left arm) Cuff size:   large  Vitals Entered By: Levon Hedger (December 25, 2009 10:43 AM)  Nutrition Counseling: Patient's BMI is greater than 25 and therefore counseled on weight management options. CC: follow-up visit DM.Marland Kitchen.feet discomfort Is Patient Diabetic? Yes Pain Assessment Patient in pain? no      CBG Result 131 CBG Device ID B  Does patient need assistance? Functional Status Self care Ambulation Normal LMP (date): 11/2009 LMP - Character: normal     Enter LMP: 11/2009 Last PAP Result  Specimen Adequacy: Satisfactory for evaluation.   Interpretation/Result:Negative for intraepithelial Lesion or Malignancy.      CC:  follow-up visit DM.Marland Kitchen.feet discomfort.  History of Present Illness:  Pt into the office to f/u on cough. Pt was last treated with rantidine 150mg  by mouth two times a day.  She is taking as ordered.  Cough has improved. Pt reports that she has been having some nasal drainage alterating with congestion. She has been taking claritin OTC with some relief. Pt is aware that she is not able to take any OTC meds due to HTN and diabetes.  Allergies (verified): 1)  ! * Lisinopril  Review of Systems CV:  Denies chest pain or discomfort. Resp:  Denies cough; cough has improved. GI:  Denies abdominal pain, indigestion, nausea, and vomiting. Neuro:  Denies headaches.  Physical Exam  General:  alert.   Head:  normocephalic.   Nose:  inflammed turbinates Lungs:  normal breath sounds.   Heart:  normal rate and regular rhythm.    Diabetes Management Exam:    Foot Exam (with socks and/or shoes not present):       Sensory-Monofilament:          Left  foot: normal          Right foot: normal   Impression & Recommendations:  Problem # 1:  ACID REFLUX DISEASE (ICD-530.81) pt is doing well.  continue current meds Her updated medication list for this problem includes:    Ranitidine Hcl 150 Mg Tabs (Ranitidine hcl) ..... One tablet by mouth before breakfast and before bedtime  Problem # 2:  COUGH (ICD-786.2) improved with ranitidine - continue  Problem # 3:  ALLERGIC RHINITIS (ICD-477.9)  pt may take claritin otc will give nasal spray to use two times a day   Her updated medication list for this problem includes:    Astelin 137 Mcg/spray Soln (Azelastine hcl) ..... One spray in each nostril two times a day  Complete Medication List: 1)  Glucophage 500 Mg Tabs (Metformin hcl) .Marland Kitchen.. 1 tablet by mouth two times a day for diabetes 2)  Cyclessa Tabs (Desogestrel-ethinyl estradiol) .... One tablet by mouth daily 3)  Prodigy Blood Glucose Monitor Devi (Blood glucose monitoring suppl) .... Dispense meter to check blood sugar dx - 250.02 4)  Prodigy Blood Glucose Test Strp (Glucose blood) .... Use to check blood sugar once daily dx = 250.02 5)  Prodigy Twist Top Lancets 28g Misc (Lancets) .... Use to check blood sugar daily before breakfast 6)  Pravastatin  Sodium 10 Mg Tabs (Pravastatin sodium) .Marland Kitchen.. 1 tablet by mouth nightly for cholesterol 7)  Cozaar 25 Mg Tabs (Losartan potassium) .... One tablet by mouth daily for blood pressure **pharmacy - d/c lisinopril** 8)  Ranitidine Hcl 150 Mg Tabs (Ranitidine hcl) .... One tablet by mouth before breakfast and before bedtime 9)  Astelin 137 Mcg/spray Soln (Azelastine hcl) .... One spray in each nostril two times a day  Other Orders: Capillary Blood Glucose/CBG (81191)  Patient Instructions: 1)  You may take claritin over the counter for allergy symptoms. 2)  No medications with decongestants as this will elevated your blood pressure. 3)  Use the nasal spray with nasal congestion - hold head down.   Spray in each nostril two times a day 4)  Follow up in 6 weeks for a nurse visit - Flu vaccine 5)  Follow up with n.martin,fnp in 3 months for diabetes. Prescriptions: ASTELIN 137 MCG/SPRAY SOLN (AZELASTINE HCL) One spray in each nostril two times a day  #1 x 0   Entered and Authorized by:   Lehman Prom FNP   Signed by:   Lehman Prom FNP on 12/25/2009   Method used:   Samples Given   RxID:   [id number] GLUCOPHAGE 500 MG TABS (METFORMIN HCL) 1 tablet by mouth two times a day for diabetes  #60 x 5   Entered and Authorized by:   Lehman Prom FNP   Signed by:   Lehman Prom FNP on 12/25/2009   Method used:   Faxed to ...       [facility] (retail)       [address] [place], [place]  [address]       Ph: [phone]       Fax: [phone]   RxID:   [id number]   Last LDL:                                                 90 (10/24/2009 9:29:00 PM)        Diabetic Foot Exam    10-g (5.07) Semmes-Weinstein Monofilament Test Performed by: Levon Hedger          Right Foot          Left Foot Visual Inspection               Test Control      normal         normal Site 1         normal         normal Site 2         normal         normal Site 3         normal         normal Site 4         normal         normal Site 5         normal         normal Site 6         normal         normal Site 7         normal         normal Site 8  normal         normal Site 9         normal         normal Site 10         normal         normal  Impression      normal         normal

## 2010-05-14 NOTE — Letter (Signed)
Summary: Lipid Letter  [facility]  [address] [place], [place] [address]   Phone: [phone]  Fax: [phone]    10/28/2009  Stacey Christian [address] [place], [place]  [address]  Dear Burman Foster:  We have carefully reviewed your last lipid profile from 10/24/2009 and the results are noted below with a summary of recommendations for lipid management.    Cholesterol:       161     Goal: less than 200   HDL "good" Cholesterol:   53     Goal: greater than 40   LDL "bad" Cholesterol:   90     Goal: less than 100   Triglycerides:       91     Goal: less than 150    Labs done during recent office visit were ok. Continue your current cholesterol medications.     Current Medications: 1)    Prenatal Multivitamin-ultra  Tabs (Prenatal vit-dss-fe cbn-fa) .Marland Kitchen.. 1 tablet by mouth daily 2)    Glucophage 500 Mg Tabs (Metformin hcl) .Marland Kitchen.. 1 tablet by mouth two times a day for diabetes 3)    Cyclessa  Tabs (Desogestrel-ethinyl estradiol) .... One tablet by mouth daily 4)    Prodigy Blood Glucose Monitor  Devi (Blood glucose monitoring suppl) .... Dispense meter to check blood sugar dx - 250.02 5)    Prodigy Blood Glucose Test  Strp (Glucose blood) .... Use to check blood sugar once daily dx = 250.02 6)    Prodigy Twist Top Lancets 28g  Misc (Lancets) .... Use to check blood sugar daily before breakfast 7)    Pravastatin Sodium 10 Mg Tabs (Pravastatin sodium) .Marland Kitchen.. 1 tablet by mouth nightly for cholesterol 8)    Lisinopril 5 Mg Tabs (Lisinopril) .... One tablet by mouth daily for blood pressure  If you have any questions, please call. We appreciate being able to work with you.   Sincerely,    [facility] Lehman Prom FNP

## 2010-05-14 NOTE — Letter (Signed)
Summary: *HSN Results Follow up  [facility]  [address] [place], [place] [address]   Phone: [phone]  Fax: [phone]      08/01/2009   NINETTE COTTA [address]ARCIA [address] [place], [place]  [address]   Dear  Ms. [name],                            ____S.Drinkard,FNP   ____D. Gore,FNP       ____B. McPherson,MD   ____V. Rankins,MD    ____E. Mulberry,MD    __X__N. Daphine Deutscher, FNP  ____D. Reche Dixon, MD    ____K. Philipp Deputy, MD    ____Other     This letter is to inform you that your recent test(s):  ___X____Pap Smear    ___X____Lab Test     _______X-ray    ___X____ is within acceptable limits  _______ requires a medication change  _______ requires a follow-up lab visit  _______ requires a follow-up visit with your provider   Comments:  Labs done during recent office visit are normal.  Pap Smear results _______________________________.       _________________________________________________________ If you have any questions, please contact our office [phone].                    Sincerely,    Lehman Prom FNP [facility]

## 2010-05-14 NOTE — Assessment & Plan Note (Signed)
Summary: Diabetes/Cough   Vital Signs:  Patient profile:   40 year old female Menstrual status:  regular LMP:     10/2009 Weight:      226.2 pounds BMI:     40.21 Pulse rate:   76 / minute Pulse rhythm:   regular Resp:     20 per minute BP sitting:   124 / 78  (left arm) Cuff size:   large  Vitals Entered By: Levon Hedger (December 04, 2009 9:11 AM)  Nutrition Counseling: Patient's BMI is greater than 25 and therefore counseled on weight management options. CC: follow-up visit DM, Hypertension Management, Lipid Management, Abdominal Pain Is Patient Diabetic? Yes Pain Assessment Patient in pain? no      CBG Result 102 CBG Device ID [id number]  Does patient need assistance? Functional Status Self care Ambulation Normal LMP (date): 10/2009 LMP - Character: normal     Enter LMP: 10/2009 Last PAP Result  Specimen Adequacy: Satisfactory for evaluation.   Interpretation/Result:Negative for intraepithelial Lesion or Malignancy.      CC:  follow-up visit DM, Hypertension Management, Lipid Management, and Abdominal Pain.  History of Present Illness:  Pt into the office for diabetes f/u.  Pt was seen for an acute visit on last week - cough.  ACE stopped and she was started on benicar but she has not yet recieved the benicar from the pharmacy.  Notes also that at night she has nausea which at times causes some vomiting when she is lying down. +spicy foods at times +eats pizza a lot because it is quick and easy +drinks soda and coffee +onion  -tobacco abuse  Diabetes Management History:      The patient is a 40 years old female who comes in for evaluation of Type 2 Diabetes Mellitus.  She has not been enrolled in the "Diabetic Education Program".  She states lack of understanding of dietary principles and is not following her diet appropriately.  No sensory loss is reported.  Self foot exams are not being performed.  She is checking home blood sugars.  She says that she is  exercising.        Hypoglycemic symptoms are not occurring.  No hyperglycemic symptoms are reported.        No changes have been made to her treatment plan since last visit.    Dyspepsia History:      There is no prior history of GERD.  The patient does not have a prior history of documented ulcer disease.  The dominant symptom is not heartburn or acid reflux.  An H-2 blocker medication is currently being taken.  She has no history of a positive H. Pylori serology.  No previous upper endoscopy has been done.    Hypertension History:      She denies headache, chest pain, and palpitations.  She notes no problems with any antihypertensive medication side effects.        Positive major cardiovascular risk factors include diabetes, hyperlipidemia, and hypertension.  Negative major cardiovascular risk factors include female age less than 71 years old and non-tobacco-user status.        Further assessment for target organ damage reveals no history of ASHD, cardiac end-organ damage (CHF/LVH), stroke/TIA, peripheral vascular disease, renal insufficiency, or hypertensive retinopathy.    Lipid Management History:      Positive NCEP/ATP III risk factors include diabetes and hypertension.  Negative NCEP/ATP III risk factors include female age less than 8 years old, no history of early menopause  without estrogen hormone replacement, non-tobacco-user status, no ASHD (atherosclerotic heart disease), no prior stroke/TIA, no peripheral vascular disease, and no history of aortic aneurysm.        The patient states that she does not know about the "Therapeutic Lifestyle Change" diet.  The patient does not know about adjunctive measures for cholesterol lowering.  She expresses no side effects from her lipid-lowering medication.  The patient denies any symptoms to suggest myopathy or liver disease.     Allergies (verified): No Known Drug Allergies  Review of Systems CV:  Denies chest pain or discomfort. Resp:   Complains of cough; ACE stopped during last visit.  Cough has improved some.Marland Kitchen GI:  Complains of vomiting; denies abdominal pain and nausea; "Nasty taste in my mouth"  Usually some emesis at night..  Physical Exam  General:  alert.   Head:  normocephalic.   Lungs:  normal breath sounds.   Heart:  normal rate and regular rhythm.   Msk:  up to the exam table Neurologic:  alert & oriented X3.    Diabetes Management Exam:    Foot Exam (with socks and/or shoes not present):       Sensory-Monofilament:          Left foot: normal          Right foot: normal       Nails:          Left foot: normal          Right foot: normal   Impression & Recommendations:  Problem # 1:  ACID REFLUX DISEASE (ICD-530.81) advised pt of foods to avoid pt can start ranitidine before meals and bedtime handout given to pt Her updated medication list for this problem includes:    Ranitidine Hcl 150 Mg Tabs (Ranitidine hcl) ..... One tablet by mouth before breakfast and before bedtime  Problem # 2:  COUGH (ICD-786.2) pt has stopped ACE -  cough may also be caused by #1 will start ranitidine  Problem # 3:  DIABETES MELLITUS (ICD-250.00) Hgba1c = 7.1 pt needs better control - will advise her to follow diet and start exercise Her updated medication list for this problem includes:    Glucophage 500 Mg Tabs (Metformin hcl) .Marland Kitchen... 1 tablet by mouth two times a day for diabetes    Cozaar 25 Mg Tabs (Losartan potassium) ..... One tablet by mouth daily for blood pressure **pharmacy - d/c lisinopril**  Orders: Capillary Blood Glucose/CBG (82948) Hemoglobin A1C (83036)  Problem # 4:  HYPERCHOLESTEROLEMIA (ICD-272.0) continue current medicaitons Her updated medication list for this problem includes:    Pravastatin Sodium 10 Mg Tabs (Pravastatin sodium) .Marland Kitchen... 1 tablet by mouth nightly for cholesterol  Problem # 5:  OBESITY (ICD-278.00) advised pt to increase exercise in effort to lose weight  Complete  Medication List: 1)  Glucophage 500 Mg Tabs (Metformin hcl) .Marland Kitchen.. 1 tablet by mouth two times a day for diabetes 2)  Cyclessa Tabs (Desogestrel-ethinyl estradiol) .... One tablet by mouth daily 3)  Prodigy Blood Glucose Monitor Devi (Blood glucose monitoring suppl) .... Dispense meter to check blood sugar dx - 250.02 4)  Prodigy Blood Glucose Test Strp (Glucose blood) .... Use to check blood sugar once daily dx = 250.02 5)  Prodigy Twist Top Lancets 28g Misc (Lancets) .... Use to check blood sugar daily before breakfast 6)  Pravastatin Sodium 10 Mg Tabs (Pravastatin sodium) .Marland Kitchen.. 1 tablet by mouth nightly for cholesterol 7)  Cozaar 25 Mg Tabs (Losartan potassium) .Marland KitchenMarland KitchenMarland Kitchen  One tablet by mouth daily for blood pressure **pharmacy - d/c lisinopril** 8)  Ranitidine Hcl 150 Mg Tabs (Ranitidine hcl) .... One tablet by mouth before breakfast and before bedtime  Diabetes Management Assessment/Plan:      The following lipid goals have been established for the patient: Total cholesterol goal of 200; LDL cholesterol goal of 100; HDL cholesterol goal of 40; Triglyceride goal of 150.  Her blood pressure goal is < 130/80.    Dyspepsia Assessment/Plan:  Step Therapy: GERD Treatment Protocols:    Step-1: started    H-2 blocker chosen: Ranitidine 150mg  by mouth at bedtime  Hypertension Assessment/Plan:      The patient's hypertensive risk group is category C: Target organ damage and/or diabetes.  Her calculated 10 year risk of coronary heart disease is 2 %.  Today's blood pressure is 124/78.  Her blood pressure goal is < 130/80.  Lipid Assessment/Plan:      Based on NCEP/ATP III, the patient's risk factor category is "history of diabetes".  The patient's lipid goals are as follows: Total cholesterol goal is 200; LDL cholesterol goal is 100; HDL cholesterol goal is 40; Triglyceride goal is 150.     Diabetic Foot Exam Foot Inspection Is there a history of a foot ulcer?              No Is there a foot ulcer now?               No Can the patient see the bottom of their feet?          No Are the shoes appropriate in style and fit?          Yes Is there swelling or an abnormal foot shape?          No Are the toenails long?                No Are the toenails thick?                No Are the toenails ingrown?              No Is there heavy callous build-up?              No Is there pain in the calf muscle (Intermittent claudication) when walking?    NoIs there a claw toe deformity?              No Is there elevated skin temperature?            No Is there limited ankle dorsiflexion?            No Is there foot or ankle muscle weakness?            No  Diabetic Foot Care Education Patient educated on appropriate care of diabetic feet.  Pulse Check          Right Foot          Left Foot Dorsalis Pedis:        normal            normal    10-g (5.07) Semmes-Weinstein Monofilament Test Performed by: Levon Hedger          Right Foot          Left Foot Visual Inspection                 Patient Instructions: 1)  Cough/nausea/vomiting - likely caused by acid reflux 2)  read handout and  determine what foods you should stop eating 3)  Take either prilosec OTC (which you can buy over the counter at any pharmacy) or get prescription for ranitidine 150mg  by mouth two times a day (at [facility] for $4) 4)  HIgh Pressure - start cozaar when available from the pharamcy 5)  Diabetes - Your Hgba1c = 7.1 6)  This is slightly higher than on last check. Remember the goal is less 7.  I still want you to increase your exercise and start walking. 7)  Keep appointment already scheduled in 3 weeks with n.[email or web address],fnp - will assess cough and nausea/vomiting Prescriptions: RANITIDINE HCL 150 MG TABS (RANITIDINE HCL) One tablet by mouth before breakfast and before bedtime  #60 x 3   Entered and Authorized by:   Lehman Prom FNP   Signed by:   Lehman Prom FNP on 12/04/2009   Method used:   Print then Give to Patient    RxID:   [id number]    Last LDL:                                                 90 (10/24/2009 9:29:00 PM)        Diabetic Foot Exam Diabetic Foot Care Education :Patient educated on appropriate care of diabetic feet.  Pulse Check          Right Foot          Left Foot Dorsalis Pedis:        normal            normal    10-g (5.07) Semmes-Weinstein Monofilament Test Performed by: Levon Hedger          Right Foot          Left Foot Visual Inspection               Test Control      normal         normal Site 1         normal         normal Site 2         normal         normal Site 3         normal         normal Site 4         normal         normal Site 5         normal         normal Site 6         normal         normal Site 7         normal         normal Site 8         normal         normal Site 9         normal         normal Site 10         normal         normal  Impression      normal         normal   Laboratory Results   Blood Tests   Date/Time Received: December 04, 2009 9:35 AM   HGBA1C: 7.1%   (Normal Range:  Non-Diabetic - 3-6%   Control Diabetic - 6-8%) CBG Random:: 102mg /dL

## 2010-05-14 NOTE — Miscellaneous (Signed)
Summary: Med change  Clinical Lists Changes  Medications: Added new medication of TRI-NORINYL (28) 0.5/1/0.5-35 MG-MCG TABS (NORETHIN-ETH ESTRAD TRIPHASIC) One tablet by mouth daily Removed medication of CYCLESSA  TABS (DESOGESTREL-ETHINYL ESTRADIOL) One tablet by mouth daily  Appended Document: Med change N.[email or web address],fnp spoke with Diane from [facility] pharmacy regarding the change in meds

## 2010-05-15 NOTE — Assessment & Plan Note (Signed)
Summary: Diabetes   Vital Signs:  Patient profile:   40 year old female Menstrual status:  regular LMP:     03/2010 Weight:      226.2 pounds BMI:     40.21 Temp:     97.5 degrees F oral Pulse rate:   76 / minute Pulse rhythm:   regular Resp:     20 per minute BP sitting:   130 / 80  (left arm) Cuff size:   large  Vitals Entered By: Levon Hedger (March 26, 2010 9:11 AM)  Nutrition Counseling: Patient's BMI is greater than 25 and therefore counseled on weight management options. CC: follow-up visit...feeling bad from a cold that started last Monday., Lipid Management, Abdominal Pain, Hypertension Management Is Patient Diabetic? Yes Pain Assessment Patient in pain? no      CBG Result 145 CBG Device ID B  Does patient need assistance? Functional Status Self care Ambulation Normal LMP (date): 03/2010 LMP - Character: normal     Enter LMP: 03/2010 Last PAP Result  Specimen Adequacy: Satisfactory for evaluation.   Interpretation/Result:Negative for intraepithelial Lesion or Malignancy.      CC:  follow-up visit...feeling bad from a cold that started last Monday., Lipid Management, Abdominal Pain, and Hypertension Management.  History of Present Illness:  Pt into the office for diabetes f/u. Acute symptoms of nasopharyngiits. +cough slowly improving +nasal congestion (using nasal spray as ordered on last visit) -fever   Reports that she has to get into the freezer at work once weekly and symptoms were improving until she had to get back into the freezer this week.  pt presents today with her medications - she uses [facility] [facility]  Diabetes Management History:      The patient is a 40 years old female who comes in for evaluation of Type 2 Diabetes Mellitus.  She has not been enrolled in the "Diabetic Education Program".  She states understanding of dietary principles and is following her diet appropriately.  No sensory loss is reported.  Self foot exams are not  being performed.  She is checking home blood sugars.  She says that she is exercising.        Hypoglycemic symptoms are not occurring.  No hyperglycemic symptoms are reported.  Other comments include: pt checks blood sugar very infrequently since she has been under good control.        There are no symptoms to suggest diabetic complications.    Dyspepsia History:      She has no alarm features of dyspepsia including no history of melena, hematochezia, dysphagia, persistent vomiting, or involuntary weight loss > 5%.  There is a prior history of GERD.  The patient does not have a prior history of documented ulcer disease.  The dominant symptom is not heartburn or acid reflux.  An H-2 blocker medication is currently being taken.  She notes that the symptoms have improved with the H-2 blocker therapy.  Symptoms have not persisted after 4 weeks of H-2 blocker treatment.    Hypertension History:      She denies headache, chest pain, and palpitations.  She notes no problems with any antihypertensive medication side effects.        Positive major cardiovascular risk factors include diabetes, hyperlipidemia, and hypertension.  Negative major cardiovascular risk factors include female age less than 53 years old and non-tobacco-user status.        Further assessment for target organ damage reveals no history of ASHD, cardiac end-organ damage (CHF/LVH), stroke/TIA,  peripheral vascular disease, renal insufficiency, or hypertensive retinopathy.    Lipid Management History:      Positive NCEP/ATP III risk factors include diabetes and hypertension.  Negative NCEP/ATP III risk factors include female age less than 88 years old, no history of early menopause without estrogen hormone replacement, non-tobacco-user status, no ASHD (atherosclerotic heart disease), no prior stroke/TIA, no peripheral vascular disease, and no history of aortic aneurysm.        The patient states that she does not know about the "Therapeutic  Lifestyle Change" diet.  The patient does not know about adjunctive measures for cholesterol lowering.  She expresses no side effects from her lipid-lowering medication.  Comments include: pt is taking meds as ordered.  The patient denies any symptoms to suggest myopathy or liver disease.    Habits & Providers  Alcohol-Tobacco-Diet     Alcohol drinks/day: 0     Tobacco Status: never  Exercise-Depression-Behavior     Does Patient Exercise: yes     Exercise Counseling: to improve exercise regimen     Have you felt down or hopeless? no     Have you felt little pleasure in things? no     Depression Counseling: not indicated; screening negative for depression     Drug Use: never  Medications Prior to Update: 1)  Glucophage 500 Mg Tabs (Metformin Hcl) .Marland Kitchen.. 1 Tablet By Mouth Two Times A Day For Diabetes 2)  Prodigy Blood Glucose Monitor  Devi (Blood Glucose Monitoring Suppl) .... Dispense Meter To Check Blood Sugar Dx - 250.02 3)  Prodigy Blood Glucose Test  Strp (Glucose Blood) .... Use To Check Blood Sugar Once Daily Dx = 250.02 4)  Prodigy Twist Top Lancets 28g  Misc (Lancets) .... Use To Check Blood Sugar Daily Before Breakfast 5)  Pravastatin Sodium 10 Mg Tabs (Pravastatin Sodium) .Marland Kitchen.. 1 Tablet By Mouth Nightly For Cholesterol 6)  Cozaar 25 Mg Tabs (Losartan Potassium) .... One Tablet By Mouth Daily For Blood Pressure **pharmacy - D/c Lisinopril** 7)  Ranitidine Hcl 150 Mg Tabs (Ranitidine Hcl) .... One Tablet By Mouth Before Breakfast and Before Bedtime 8)  Astelin 137 Mcg/spray Soln (Azelastine Hcl) .... One Spray in Each Nostril Two Times A Day 9)  Tri-Norinyl (28) 0.5/1/0.5-35 Mg-Mcg Tabs (Norethin-Eth Estrad Triphasic) .... One Tablet By Mouth Daily  Current Medications (verified): 1)  Glucophage 500 Mg Tabs (Metformin Hcl) .Marland Kitchen.. 1 Tablet By Mouth Two Times A Day For Diabetes 2)  Prodigy Blood Glucose Monitor  Devi (Blood Glucose Monitoring Suppl) .... Dispense Meter To Check Blood  Sugar Dx - 250.02 3)  Prodigy Blood Glucose Test  Strp (Glucose Blood) .... Use To Check Blood Sugar Once Daily Dx = 250.02 4)  Prodigy Twist Top Lancets 28g  Misc (Lancets) .... Use To Check Blood Sugar Daily Before Breakfast 5)  Pravastatin Sodium 10 Mg Tabs (Pravastatin Sodium) .Marland Kitchen.. 1 Tablet By Mouth Nightly For Cholesterol 6)  Cozaar 25 Mg Tabs (Losartan Potassium) .... One Tablet By Mouth Daily For Blood Pressure **pharmacy - D/c Lisinopril** 7)  Ranitidine Hcl 150 Mg Tabs (Ranitidine Hcl) .... One Tablet By Mouth Before Breakfast and Before Bedtime 8)  Astelin 137 Mcg/spray Soln (Azelastine Hcl) .... One Spray in Each Nostril Two Times A Day 9)  Tri-Norinyl (28) 0.5/1/0.5-35 Mg-Mcg Tabs (Norethin-Eth Estrad Triphasic) .... One Tablet By Mouth Daily  Allergies (verified): 1)  ! * Lisinopril  Review of Systems General:  Denies fever. ENT:  Complains of nasal congestion; denies ear discharge and  sore throat. CV:  Denies chest pain or discomfort. Resp:  Complains of cough; denies shortness of breath. GI:  Denies abdominal pain, nausea, and vomiting.  Physical Exam  General:  alert.   Head:  normocephalic.   Eyes:  pupils round.   Ears:  moderate cerumen Nose:  inflammed turbinates Lungs:  normal breath sounds.   Heart:  normal rate and regular rhythm.   Abdomen:  normal bowel sounds.   Neurologic:  alert & oriented X3.   Skin:  color normal.   Psych:  Oriented X3.    Diabetes Management Exam:    Foot Exam (with socks and/or shoes not present):       Sensory-Monofilament:          Left foot: normal          Right foot: normal   Impression & Recommendations:  Problem # 1:  DIABETES MELLITUS (ICD-250.00) Will check hgba1c today continue current meds Her updated medication list for this problem includes:    Glucophage 500 Mg Tabs (Metformin hcl) .Marland Kitchen... 1 tablet by mouth two times a day for diabetes    Cozaar 25 Mg Tabs (Losartan potassium) ..... One tablet by mouth daily  for blood pressure **pharmacy - d/c lisinopril**  Orders: Capillary Blood Glucose/CBG ([address]) T- Hemoglobin A1C (60454-09811)  Problem # 2:  HYPERTENSION, BENIGN ESSENTIAL (ICD-401.1) BP is stable continue current meds Her updated medication list for this problem includes:    Cozaar 25 Mg Tabs (Losartan potassium) ..... One tablet by mouth daily for blood pressure **pharmacy - d/c lisinopril**  Problem # 3:  HYPERCHOLESTEROLEMIA (ICD-272.0)  Her updated medication list for this problem includes:    Pravastatin Sodium 10 Mg Tabs (Pravastatin sodium) .Marland Kitchen... 1 tablet by mouth nightly for cholesterol  Problem # 4:  NEED PROPHYLACTIC VACCINATION&INOCULATION FLU (ICD-V04.81) given today  Problem # 5:  ACUTE NASOPHARYNGITIS (ICD-460) advised pt of symptoms management no decongestants due to blood pressure Her updated medication list for this problem includes:    Loratadine 10 Mg Tabs (Loratadine) ..... One tablet by mouth daily as needed for congestion  Complete Medication List: 1)  Glucophage 500 Mg Tabs (Metformin hcl) .Marland Kitchen.. 1 tablet by mouth two times a day for diabetes 2)  Prodigy Blood Glucose Monitor Devi (Blood glucose monitoring suppl) .... Dispense meter to check blood sugar dx - 250.02 3)  Prodigy Blood Glucose Test Strp (Glucose blood) .... Use to check blood sugar once daily dx = 250.02 4)  Prodigy Twist Top Lancets 28g Misc (Lancets) .... Use to check blood sugar daily before breakfast 5)  Pravastatin Sodium 10 Mg Tabs (Pravastatin sodium) .Marland Kitchen.. 1 tablet by mouth nightly for cholesterol 6)  Cozaar 25 Mg Tabs (Losartan potassium) .... One tablet by mouth daily for blood pressure **pharmacy - d/c lisinopril** 7)  Ranitidine Hcl 150 Mg Tabs (Ranitidine hcl) .... One tablet by mouth before breakfast and before bedtime 8)  Astelin 137 Mcg/spray Soln (Azelastine hcl) .... One spray in each nostril two times a day 9)  Tri-norinyl (28) 0.5/1/0.5-35 Mg-mcg Tabs (Norethin-eth estrad  triphasic) .... One tablet by mouth daily 10)  Loratadine 10 Mg Tabs (Loratadine) .... One tablet by mouth daily as needed for congestion  Other Orders: Flu Vaccine 18yrs + (91478) Admin 1st Vaccine (29562)  Diabetes Management Assessment/Plan:      The following lipid goals have been established for the patient: Total cholesterol goal of 200; LDL cholesterol goal of 100; HDL cholesterol goal of 40; Triglyceride goal of  150.  Her blood pressure goal is < 130/80.    Dyspepsia Assessment/Plan:  Step Therapy: GERD Treatment Protocols:    Step-1: improved    H-2 blocker chosen: Ranitidine 150mg  by mouth at bedtime  Hypertension Assessment/Plan:      The patient's hypertensive risk group is category C: Target organ damage and/or diabetes.  Her calculated 10 year risk of coronary heart disease is 2 %.  Today's blood pressure is 130/80.  Her blood pressure goal is < 130/80.  Lipid Assessment/Plan:      Based on NCEP/ATP III, the patient's risk factor category is "history of diabetes".  The patient's lipid goals are as follows: Total cholesterol goal is 200; LDL cholesterol goal is 100; HDL cholesterol goal is 40; Triglyceride goal is 150.       Patient Instructions: 1)  You have been given the flu vaccine today. 2)  You have a common cold.  Treat symptoms. 3)  Continue nasal spray as you are. 4)  May take coricidan HBP for cough 5)  May also take allergy pill such as loratadine 10mg  by mouth daily to help with symptoms. 6)  DO NOT take any decongestants as this will raise your blood pressure. 7)  Diabetes - Hgba1c will be checked today.  It was 7.1 on last visit. 8)  Schedule your next visit for a complete physical exam (last done 07/31/2009 so schedule after this date)  Come fasting for labs.  Will need lipids, cmp, cbc, tsh, PAP, u/a, microalbumin. Prescriptions: LORATADINE 10 MG TABS (LORATADINE) One tablet by mouth daily as needed for congestion  #30 x 3   Entered and Authorized by:    Lehman Prom FNP   Signed by:   Lehman Prom FNP on 03/26/2010   Method used:   Print then Give to Patient   RxID:   [id number] COZAAR 25 MG TABS (LOSARTAN POTASSIUM) One tablet by mouth daily for blood pressure **pharmacy - d/c lisinopril**  #30 x 5   Entered and Authorized by:   Lehman Prom FNP   Signed by:   Lehman Prom FNP on 03/26/2010   Method used:   Faxed to ...       [facility] (retail)       [address] [place], [place]  [address]       Ph: [phone]       Fax: [phone]   RxID:   [id number] PRAVASTATIN SODIUM 10 MG TABS (PRAVASTATIN SODIUM) 1 tablet by mouth nightly for cholesterol  #30 x 5   Entered and Authorized by:   Lehman Prom FNP   Signed by:   Lehman Prom FNP on 03/26/2010   Method used:   Faxed to ...       [facility] - [facility] (retail)       [address] [place], [place]  [address]       Ph: [phone]       Fax: [phone]   RxID:   [id number] GLUCOPHAGE 500 MG TABS (METFORMIN HCL) 1 tablet by mouth two times a day for diabetes  #60 x 5   Entered and Authorized by:   Lehman Prom FNP   Signed by:   Lehman Prom FNP on 03/26/2010   Method used:   Faxed to ...       [facility] - Pharmac (retail)  [address] [place], [place]  [address]       Ph: [phone]       Fax: [phone]   RxID:   [id number]    Orders Added: 1)  Capillary Blood Glucose/CBG [82948] 2)  Flu Vaccine 10yrs + [90658] 3)  Admin 1st Vaccine [90471] 4)  Est. Patient Level III [84132] 5)  T- Hemoglobin A1C [83036-23375]   Immunizations Administered:  Influenza Vaccine # 1:    Vaccine Type: Fluvax 3+    Site: left deltoid    Mfr: GlaxoSmithKline    Dose: 0.5 ml    Route: IM    Given by: Levon Hedger    Exp. Date: 10/11/2010    Lot #: [id number]    VIS given: 11/05/09 version given  March 26, 2010.  Flu Vaccine Consent Questions:    Do you have a history of severe allergic reactions to this vaccine? no    Any prior history of allergic reactions to egg and/or gelatin? no    Do you have a sensitivity to the preservative Thimersol? no    Do you have a past history of Guillan-Barre Syndrome? no    Do you currently have an acute febrile illness? no    Have you ever had a severe reaction to latex? no    Vaccine information given and explained to patient? yes    Are you currently pregnant? no    ndc  [phone]  Immunizations Administered:  Influenza Vaccine # 1:    Vaccine Type: Fluvax 3+    Site: left deltoid    Mfr: GlaxoSmithKline    Dose: 0.5 ml    Route: IM    Given by: Levon Hedger    Exp. Date: 10/11/2010    Lot #: [id number]    VIS given: 11/05/09 version given March 26, 2010.  Last LDL:                                                 90 (10/24/2009 9:29:00 PM)          Diabetic Foot Exam    10-g (5.07) Semmes-Weinstein Monofilament Test Performed by: Levon Hedger          Right Foot          Left Foot Visual Inspection               Test Control      normal         normal Site 1         normal         normal Site 2         normal         normal Site 3         normal         normal Site 4         normal         normal Site 5         normal         normal Site 6         normal         normal Site 7         normal         normal Site 8  normal         normal Site 9         normal         normal Site 10         normal         normal  Impression      normal         normal

## 2010-08-26 NOTE — Discharge Summary (Signed)
NAMEANAIS, DENSLOW NO.:  [id number]   MEDICAL RECORD NO.:  [id number]          PATIENT TYPE:  WOC   LOCATION:  WOC                          FACILITY:  [facility]   PHYSICIAN:  Tilda Burrow, M.D. DATE OF BIRTH:  Sep 02, 1970   DATE OF ADMISSION:  02/15/2008  DATE OF DISCHARGE:  02/19/2008                               DISCHARGE SUMMARY   ATTENDING PHYSICIAN AT DISCHARGE:  Dr. Tilda Burrow from [facility].   PRIMARY CARE PHYSICIAN:  [facility] Department.   DISCHARGE DIAGNOSES:  1. Status post low transverse C-section.  2. Status post non-reassuring fetal heart tone.  3. Gestational diabetes mellitus, resolving.   PROCEDURES PERFORMED:  On admission includes,  1. Low transverse C-section.  2. NSTs.  3. Epidural anesthesia.   HISTORY OF PRESENT ILLNESS:  The patient is a 40 year old G5, P2-0-2-2  who presented at 63 and 6 weeks due to lack of fetal movement.  She was  evaluated in the MAU and felt to be appropriate for admission given non-  reassuring fetal heart tones.   HOSPITAL COURSE:  The patient was admitted for observation status  overnight due to non-reassuring fetal heart tones while in the MAU.  Risk factors included a gestational diabetes, obesity, and premature  dates on admission.  Over the next few hours of admission, the patient  underwent an NST which demonstrated non-reassuring fetal heart tones in  the shape of a pseudo sine waves, in addition to a number of variable  decelerations.  The patient was felt to be appropriate for emergent C-  section and the patient was taken to the OR.  While in the operating  room, the baby was found to have nuchal cord x3 during the low  transverse C-section operation.  A 4-pound 12-ounce female infant with  Apgars of 7 and 9 was delivered with a clear amniotic fluid.  Normal  placenta was discovered.  The patient's blood gas is 7.42.  EBL was 1800  mL.  The patient underwent an unremarkable  postoperative course.  A  number of CBGs were checked following delivery, and found to be in  normal levels between 70 and 110.  On the day of discharge, the patient  was experiencing normal flatus, urinating appropriately, ambulating  appropriately, tolerating a general diet, had normal lochia.  The  patient requested Depo-Provera as a bridge, to add yet undecided form of  contraception.  Additionally, she decided to circumcision for her child.  On the day of discharge, the patient was felt to be appropriate for  outpatient care.  Please see below for followup appointments.   LABORATORY DATA:  Lab values include the following blood type is O  positive.  Antibody is negative.  Hepatitis B surface antigen is  negative.  Rubella status is immune.  HIV exposure was negative.  VDRL  at this point is nonreactive and GBS status is unknown at this point.  Antibiotics were given prior to delivery of note.   DISCHARGE DISPOSITION:  Will be to home in improved and stable  condition.   DISCHARGE MEDICATIONS:  Include the following,  1. Percocet  5/325 take 1-2 tablets p.o. q.4-6 h. p.r.n. for pain.  2. Tylenol 650 mg p.o. q.4-6 h. p.r.n. for pain.  3. Prenatal vitamins take 1 tablet by mouth daily.   FOLLOWUP APPOINTMENTS:  Will be with the [facility]  Department in approximately 4 weeks.   [facility] nurse to remove her staples at 5-7 days postoperative course.   Regular postpartum 6 week visit in [facility] Department in  approximately 6 weeks.   DIET:  Will be diabetic.   ACTIVITY:  Pelvic rest for 6 weeks.  No heavy lifting for approximately  6 weeks.   DISCHARGE INSTRUCTIONS:  The patient is advised of preeclamptic symptoms  at this point.  She is advised to call primary physicians office in the  event of repeated headaches, double vision or blurred vision, right  upper quadrant abdominal pain, any swelling in the hands or face, and is  advised to follow up  with a blood pressure check.  Regarding a  gestational diabetes, at this point the patient felt to be resolved,  however, at risk for diabetes, given body habitus and gestational  diabetes.  We will follow up as an outpatient.     ______________________________  Obstetrics Resident      ______________________________  Tilda Burrow, M.D.    OR/MEDQ  D:  02/19/2008  T:  02/20/2008  Job:  [id number]   cc:   Tilda Burrow, M.D.  Fax: [phone]

## 2010-08-26 NOTE — Discharge Summary (Signed)
Stacey Christian, DEVLIN NO.:  [id number]   MEDICAL RECORD NO.:  [id number]          PATIENT TYPE:  INP   LOCATION:  9128                          [facility]ILITY[facility]  [facility]   PHYSICIAN:  Tilda Burrow, M.D. DATE OF BIRTH:  11/11/1970   DATE OF ADMISSION:  02/15/2008  DATE OF DISCHARGE:  02/19/2008                               DISCHARGE SUMMARY   ADMITTING DIAGNOSES:  1. Pregnancy at 35 weeks 6 days.  2. Diabetes mellitus.  3. Nonreassuring fetal status.   DISCHARGE DIAGNOSES:  1. Pregnancy at 35 weeks 6 days, delivered.  2. Nonreassuring fetal status.  3. Gestational diabetes mellitus, diet controlled, type A2.   PROCEDURE:  Primary low transverse cervical cesarean section.   HISTORY OF PRESENT ILLNESS:  This 40 year old female gravida 5, para 2-0-  2-2 at 4 and 5 weeks was admitted for decreased fetal movement since 7-  8 p.m. on February 15, 2008.  She denies any bleeding or discharge.  She  has an excellent blood sugar control on insulin.   RECENT MEDICINES:  1. Novolin subcu 30 units q.a.m.  2. Humulin R insulin 14 units in the morning and 10 units every      evening with Novolin NPH 20 units at bedtime.   HOSPITAL COURSE:  The patient was admitted for decreased fetal movement.  The hospital course consisted of the patient being placed in Antenatal  Unit, where she was found to have spontaneous decelerations in the 90s,  greater than 1 minute with recovery.  Decision was made for cesarean  section at 0030, due to nonreassuring status of the fetus and the breech  presentation.  She was scheduled for the operating room at 3 a.m.  At 2  a.m., the decision was made to proceed with cesarean section due to  nonreassuring status with a prolonged deceleration recurring.  She was  taken to the operating room, where cesarean delivery performed revealed  a breech and foot with nuchal cord x2.  Postoperatively, the patient did  well and had an uneventful prenatal course.   She was considered stable  for discharge on February 19, 2008 in stable condition.   DISCHARGE MEDICATIONS:  1. Percocet 5/325 mg every 6 hours p.r.n. pain.  2. Tylenol 650 mg q.4 h. p.r.n. pain.   DISCHARGE INSTRUCTIONS:  Staple removal in 5-7 days at home.  Follow up  with [facility] Department in 4 weeks.      Tilda Burrow, M.D.  Electronically Signed     JVF/MEDQ  D:  03/18/2008  T:  03/18/2008  Job:  [id number]   cc:   [facility]

## 2010-08-26 NOTE — Op Note (Signed)
Stacey Christian, Stacey Christian NO.:  [id number]   MEDICAL RECORD NO.:  [id number]          PATIENT TYPE:  INP   LOCATION:  9128                          FACILITY:  [facility]   PHYSICIAN:  Tilda Burrow, M.D. DATE OF BIRTH:  09/09/1970   DATE OF PROCEDURE:  DATE OF DISCHARGE:  02/19/2008                               OPERATIVE REPORT   PREOPERATIVE DIAGNOSES:  1. Pregnancy at 35 weeks 6 days.  2. Nonreassuring fetal heart.  3. Breech presentation.  4. Gestational diabetes type 2.   DISCHARGE DIAGNOSES:  1. Pregnancy at 35 weeks 6 days.  2. Nonreassuring fetal heart.  3. Breech presentation.  4. Gestational diabetes type 2.   PROCEDURE:  Primary low transverse cervical cesarean section, nuchal  cord x3, breech presentation.   FINDINGS:  A 4-pound, 12-ounce female infant Apgars 7 and 9, clear  amniotic fluid, normal volume with 3 loops cord around the neck once the  baby was delivered.  Cord blood venous gas 7.42.   DETAILS OF THE PROCEDURE:  The patient was taken to the operating room  at 2 a.m. after recurrent bradycardia into the 60s lasting approximately  a minute occurring spontaneously without associated contractions.  Time-  out was conducted and spinal anesthesia introduced.  Pfannenstiel-type  incision was performed with a sharp dissection through the fascia.  Elevation of the fascia up to rectus muscles and splitting the muscles  in the midline identifying the peritoneal cavity entering and developing  a bladder flap from lower uterine segment.  Transverse uterine incision  was performed using a knife to open the initial incision site and  lateral traction to open the incision.  Fetal products were identified  and brought into the incision.  The legs were swept in front of the body  as the baby delivered.  Arms were swept down.  The neck had 3 loops of  cord around it which were left alone until the head was delivered in a  careful fashion holding the jaw with  the index finger.  The patient  tolerated the delivery well.  Cord was unwound, clamped and then the  infant passed to waiting pediatrician.  Apgars were 7 and 9.  Venous  blood gas was obtained as arterial blood gas was technically difficult  to obtain, pH was 7.42.   The placenta was delivered intact, Tomasa Blase presentation, and 3-vessel  cord confirmed.  Single layer of running locking closure of the uterine  incision followed.  Bladder flap was loosely reapproximated.  The  anterior peritoneum  was inspected, irrigated, and closed with 2-0 chromic.  The fascia  closed with 0 Vicryl.  Subcutaneous tissue reapproximated with  interrupted 2-0 plain and staple closure of the skin.  After completion  of the procedure the patient went to recovery room in good condition.  Sponge and needle counts were correct.      Tilda Burrow, M.D.  Electronically Signed     JVF/MEDQ  D:  03/18/2008  T:  03/18/2008  Job:  [id number]   cc:   [facility] Department

## 2011-01-08 LAB — POCT URINALYSIS DIP (DEVICE)
Bilirubin Urine: NEGATIVE
Bilirubin Urine: NEGATIVE
Glucose, UA: NEGATIVE
Glucose, UA: NEGATIVE
Ketones, ur: 15 — AB
Ketones, ur: >=160 — AB
Ketones, ur: 40 — AB
Nitrite: NEGATIVE
Nitrite: NEGATIVE
Operator id: 297281
Protein, ur: NEGATIVE
Protein, ur: NEGATIVE
Specific Gravity, Urine: 1.015
Specific Gravity, Urine: 1.02
Urobilinogen, UA: 0.2
pH: 6.5
pH: 6.5

## 2011-01-09 LAB — POCT URINALYSIS DIP (DEVICE)
Bilirubin Urine: NEGATIVE
Bilirubin Urine: NEGATIVE
Glucose, UA: NEGATIVE
Ketones, ur: 40 — AB
Ketones, ur: 40 — AB
Ketones, ur: NEGATIVE
Operator id: 120861
Operator id: 297281
Operator id: 297281
Protein, ur: NEGATIVE

## 2011-01-12 LAB — STREP B DNA PROBE: Strep Group B Ag: NEGATIVE

## 2011-01-12 LAB — POCT URINALYSIS DIP (DEVICE)
Bilirubin Urine: NEGATIVE
Bilirubin Urine: NEGATIVE
Bilirubin Urine: NEGATIVE
Hgb urine dipstick: NEGATIVE
Hgb urine dipstick: NEGATIVE
Hgb urine dipstick: NEGATIVE
Hgb urine dipstick: NEGATIVE
Ketones, ur: NEGATIVE
Nitrite: NEGATIVE
Nitrite: NEGATIVE
Nitrite: NEGATIVE
Nitrite: NEGATIVE
Operator id: 194561
Protein, ur: NEGATIVE
Protein, ur: 30 — AB
Protein, ur: NEGATIVE
Protein, ur: NEGATIVE
Protein, ur: NEGATIVE
Urobilinogen, UA: 0.2
Urobilinogen, UA: 0.2
pH: 6.5
pH: 7
pH: 7
pH: 7

## 2011-01-12 LAB — URINALYSIS, ROUTINE W REFLEX MICROSCOPIC
Ketones, ur: NEGATIVE
Protein, ur: NEGATIVE
Urobilinogen, UA: 0.2

## 2011-01-12 LAB — WET PREP, GENITAL: Trich, Wet Prep: NONE SEEN

## 2011-01-12 LAB — URINE CULTURE: Colony Count: 10000

## 2011-01-12 LAB — URINE MICROSCOPIC-ADD ON

## 2011-01-12 LAB — GC/CHLAMYDIA PROBE AMP, GENITAL: GC Probe Amp, Genital: NEGATIVE

## 2011-01-13 LAB — CROSSMATCH
ABO/RH(D): O POS
Antibody Screen: NEGATIVE

## 2011-01-13 LAB — GLUCOSE, CAPILLARY
Glucose-Capillary: 62 — ABNORMAL LOW
Glucose-Capillary: 67 — ABNORMAL LOW
Glucose-Capillary: 79

## 2011-01-13 LAB — RAPID URINE DRUG SCREEN, HOSP PERFORMED: Tetrahydrocannabinol: NOT DETECTED

## 2011-01-13 LAB — URINALYSIS, ROUTINE W REFLEX MICROSCOPIC
Nitrite: NEGATIVE
Specific Gravity, Urine: 1.01
Urobilinogen, UA: 0.2
pH: 7

## 2011-01-13 LAB — POCT URINALYSIS DIP (DEVICE)
Bilirubin Urine: NEGATIVE
Hgb urine dipstick: NEGATIVE
Ketones, ur: NEGATIVE
Nitrite: NEGATIVE
Protein, ur: NEGATIVE
pH: 7

## 2011-01-13 LAB — CBC
Hemoglobin: 10.6 — ABNORMAL LOW
MCHC: 33.2
MCV: 100.1 — ABNORMAL HIGH
Platelets: 290
RBC: 3.19 — ABNORMAL LOW
RBC: 3.88
WBC: 11.3 — ABNORMAL HIGH

## 2011-01-13 LAB — DIFFERENTIAL
Eosinophils Absolute: 0.4
Lymphocytes Relative: 25
Lymphs Abs: 2.8
Neutro Abs: 7.6
Neutrophils Relative %: 67

## 2011-01-13 LAB — URINE MICROSCOPIC-ADD ON

## 2011-01-14 LAB — POCT URINALYSIS DIP (DEVICE)
Bilirubin Urine: NEGATIVE
Glucose, UA: NEGATIVE
Glucose, UA: NEGATIVE
Hgb urine dipstick: NEGATIVE
Hgb urine dipstick: NEGATIVE
Specific Gravity, Urine: 1.015
Specific Gravity, Urine: 1.015

## 2011-01-28 LAB — URINALYSIS, ROUTINE W REFLEX MICROSCOPIC
Nitrite: NEGATIVE
Specific Gravity, Urine: 1.025
Urobilinogen, UA: 0.2
pH: 5.5

## 2011-01-28 LAB — URINE MICROSCOPIC-ADD ON

## 2011-01-28 LAB — CBC
HCT: 40
Hemoglobin: 13.3
MCHC: 33.2
MCV: 92
Platelets: 365
RBC: 4.35
RDW: 13.1
WBC: 9.8

## 2011-01-28 LAB — POCT PREGNANCY, URINE
Operator id: 242691
Preg Test, Ur: POSITIVE

## 2011-01-28 LAB — HCG, QUANTITATIVE, PREGNANCY: hCG, Beta Chain, Quant, S: 30 — ABNORMAL HIGH

## 2011-09-13 ENCOUNTER — Inpatient Hospital Stay (HOSPITAL_COMMUNITY)
Admission: AD | Admit: 2011-09-13 | Discharge: 2011-09-13 | Disposition: A | Discharge status: Home / Self Care | Payer: Self-pay | Source: Ambulatory Visit | Attending: Obstetrics and Gynecology | Admitting: Obstetrics and Gynecology

## 2011-09-13 ENCOUNTER — Inpatient Hospital Stay (HOSPITAL_COMMUNITY): Payer: Self-pay

## 2011-09-13 ENCOUNTER — Encounter (HOSPITAL_COMMUNITY): Payer: Self-pay | Admitting: *Deleted

## 2011-09-13 DIAGNOSIS — O468X1 Other antepartum hemorrhage, first trimester: Secondary | ICD-10-CM

## 2011-09-13 DIAGNOSIS — O418X10 Other specified disorders of amniotic fluid and membranes, first trimester, not applicable or unspecified: Secondary | ICD-10-CM

## 2011-09-13 DIAGNOSIS — O209 Hemorrhage in early pregnancy, unspecified: Secondary | ICD-10-CM | POA: Insufficient documentation

## 2011-09-13 HISTORY — DX: Essential (primary) hypertension: I10

## 2011-09-13 LAB — URINALYSIS, ROUTINE W REFLEX MICROSCOPIC
Bilirubin Urine: NEGATIVE
Glucose, UA: 100 mg/dL — AB
Ketones, ur: NEGATIVE mg/dL
Leukocytes, UA: NEGATIVE
pH: 6 (ref 5.0–8.0)

## 2011-09-13 LAB — CBC
HCT: 38.2 % (ref 36.0–46.0)
Hemoglobin: 13 g/dL (ref 12.0–15.0)
MCH: 30.7 pg (ref 26.0–34.0)
RBC: 4.23 MIL/uL (ref 3.87–5.11)

## 2011-09-13 LAB — WET PREP, GENITAL

## 2011-09-13 LAB — URINE MICROSCOPIC-ADD ON

## 2011-09-13 NOTE — MAU Note (Signed)
Pt reports vaginal bleeding x 45 minutes, does not have a pad on. Denies pain.

## 2011-09-13 NOTE — MAU Note (Signed)
Mayer Camel NP at the bedside.  Bedside U/S performed.

## 2011-09-13 NOTE — Discharge Instructions (Signed)
    ________________________________________     To schedule your Maternity Eligibility Appointment, please call [phone].  When you arrive for your appointment you must bring the following items or information listed below.  Your appointment will be rescheduled if you do not have these items or are 15 minutes late. If currently receiving Medicaid, you MUST bring: 1. Medicaid Card 2. Social Security Card 3. Picture ID 4. Proof of Pregnancy 5. Verification of current address if the address on Medicaid card is incorrect "postmarked mail" If not receiving Medicaid, you MUST bring: 1. Social Security Card 2. Picture ID 3. Birth Certificate (if available) Passport or *Green Card 4. Proof of Pregnancy 5. Verification of current address "postmarked mail" for each income presented. 6. Verification of insurance coverage, if any 7. Check stubs from each employer for the previous month (if unable to present check stub  for each week, we will accept check stub for the first and last week ill the same month.) If you can't locate check stubs, you must bring a letter from the employer(s) and it must have the following information on letterhead, typed, in English: o name of company o company telephone number o how long been with the company, if less than one month o how much person earns per hour o how many hours per week work o the gross pay the person earned for the previous month If you are 40 years old or less, you do not have to bring proof of income unless you work or live with the father of the baby and at that time we will need proof of income from you and/or the father of the baby. Green Card recipients are eligible for Medicaid for Pregnant Women (MPW)    

## 2011-09-13 NOTE — MAU Provider Note (Signed)
History     CSN: [id number]  Arrival date & time 09/13/11  1952   None     Chief Complaint  Patient presents with  . Vaginal Bleeding  . Possible Pregnancy    HPI Stacey Christian is a 41 y.o. female @ [redacted]w[redacted]d gestation who presents to MAU for vaginal bleeding. The bleeding started today and is equal to bleeding with a period. She denies abdominal pain. The history was provided by the patient.  Past Medical History  Diagnosis Date  . Hypertension   . Diabetes mellitus     Past Surgical History  Procedure Date  . Cesarean section     Family History  Problem Relation Age of Onset  . Anesthesia problems Neg Hx     History  Substance Use Topics  . Smoking status: Never Smoker   . Smokeless tobacco: Not on file  . Alcohol Use: No    OB History    Grav Para Term Preterm Abortions TAB SAB Ect Mult Living   6 3 2 1 2  2   3       Review of Systems  Constitutional: Negative for fever, chills, diaphoresis and fatigue.  HENT: Negative for ear pain, congestion, sore throat, facial swelling, neck pain, neck stiffness, dental problem and sinus pressure.   Eyes: Negative for photophobia, pain and discharge.  Respiratory: Negative for cough, chest tightness and wheezing.   Gastrointestinal: Negative for nausea, vomiting, abdominal pain, diarrhea, constipation and abdominal distention.  Genitourinary: Positive for vaginal bleeding. Negative for dysuria, frequency, flank pain and difficulty urinating.  Musculoskeletal: Negative for myalgias, back pain and gait problem.  Skin: Negative for color change and rash.  Neurological: Negative for dizziness, speech difficulty, weakness, light-headedness, numbness and headaches.  Psychiatric/Behavioral: Negative for confusion and agitation.    Allergies  Lisinopril  Home Medications  No current outpatient prescriptions on file.  BP 152/74  Pulse 89  Temp(Src) 98.9 F (37.2 C) (Oral)  Resp 18  Ht 5\' 3"  (1.6 m)  Wt 219 lb  (99.338 kg)  BMI 38.79 kg/m2  SpO2 100%  LMP 07/08/2011  Physical Exam  Nursing note and vitals reviewed. Constitutional: She is oriented to person, place, and time. She appears well-developed and well-nourished. No distress.  HENT:  Head: Normocephalic.  Eyes: EOM are normal.  Neck: Neck supple.  Cardiovascular: Normal rate.   Pulmonary/Chest: Effort normal.  Abdominal: Soft. There is no tenderness.  Genitourinary:       External genitalia without lesions. Moderate blood vaginal vault. Cervix closed, long, no CMT. Uterus approximately 10 week size.  Musculoskeletal: Normal range of motion.  Neurological: She is alert and oriented to person, place, and time. No cranial nerve deficit.  Skin: Skin is warm and dry.  Psychiatric: She has a normal mood and affect. Her behavior is normal. Judgment and thought content normal.   Assessment: Bleeding in first trimester pregnancy   [facility]  Plan:  Discussed in detail with patient results of ultrasound and instructions on threatened AB   Follow up  For prenatal care, return here as needed. ED Course  ProceduresUs Ob Comp Less 14 Wks  09/13/2011  *RADIOLOGY REPORT*  Clinical Data: Vaginal bleeding.  OBSTETRIC <14 WK ULTRASOUND  Technique:  Transabdominal ultrasound was performed for evaluation of the gestation as well as the maternal uterus and adnexal regions.  Comparison:  None.  Intrauterine gestational sac: Visualized/normal in shape. Yolk sac: Present Embryo: Present Cardiac Activity: Present Heart Rate: 167 bpm  MSD:  mm  w  d CRL:  26.4 mm  ninew  threed        [place] EDC: 04/14/2012  Maternal uterus/Adnexae: Small to moderate sized subchorionic hemorrhage is noted. Normal right ovary. Normal left ovary. No free pelvic fluid collections.  IMPRESSION:  1.  Single living intrauterine fetus estimated at 9 weeks and 3 days gestation. 2.  Small to moderate sized subchorionic hemorrhage. 3.  Normal ovaries.  Original Report Authenticated By: P. Loralie Champagne, M.D.   I have reviewed this patient's vital signs, nurses notes, appropriate labs and imaging.   MDM

## 2011-09-14 LAB — GC/CHLAMYDIA PROBE AMP, GENITAL
Chlamydia, DNA Probe: NEGATIVE
GC Probe Amp, Genital: NEGATIVE

## 2011-09-14 NOTE — MAU Provider Note (Signed)
Agree with above note.  Stacey Christian 09/14/2011 5:54 AM

## 2011-11-19 ENCOUNTER — Other Ambulatory Visit: Payer: Self-pay

## 2011-11-19 ENCOUNTER — Other Ambulatory Visit (HOSPITAL_COMMUNITY): Payer: Self-pay | Admitting: Obstetrics

## 2011-11-19 DIAGNOSIS — O09529 Supervision of elderly multigravida, unspecified trimester: Secondary | ICD-10-CM

## 2011-11-19 DIAGNOSIS — Z3689 Encounter for other specified antenatal screening: Secondary | ICD-10-CM

## 2011-11-19 DIAGNOSIS — O24919 Unspecified diabetes mellitus in pregnancy, unspecified trimester: Secondary | ICD-10-CM

## 2011-11-19 DIAGNOSIS — O169 Unspecified maternal hypertension, unspecified trimester: Secondary | ICD-10-CM

## 2011-11-26 ENCOUNTER — Encounter (HOSPITAL_COMMUNITY): Payer: Self-pay

## 2011-11-26 ENCOUNTER — Ambulatory Visit (HOSPITAL_COMMUNITY)
Admission: RE | Admit: 2011-11-26 | Discharge: 2011-11-26 | Disposition: A | Discharge status: Home / Self Care | Payer: Self-pay | Source: Ambulatory Visit | Attending: Nurse Practitioner | Admitting: Nurse Practitioner

## 2011-11-26 ENCOUNTER — Other Ambulatory Visit: Payer: Self-pay | Admitting: Obstetrics

## 2011-11-26 ENCOUNTER — Encounter: Payer: Self-pay | Attending: Obstetrics | Admitting: Dietician

## 2011-11-26 ENCOUNTER — Ambulatory Visit (HOSPITAL_COMMUNITY)
Admission: RE | Admit: 2011-11-26 | Discharge: 2011-11-26 | Disposition: A | Discharge status: Home / Self Care | Payer: Self-pay | Source: Ambulatory Visit | Attending: Obstetrics | Admitting: Obstetrics

## 2011-11-26 DIAGNOSIS — Z3689 Encounter for other specified antenatal screening: Secondary | ICD-10-CM

## 2011-11-26 DIAGNOSIS — Z843 Family history of consanguinity: Secondary | ICD-10-CM | POA: Insufficient documentation

## 2011-11-26 DIAGNOSIS — O24919 Unspecified diabetes mellitus in pregnancy, unspecified trimester: Secondary | ICD-10-CM | POA: Insufficient documentation

## 2011-11-26 DIAGNOSIS — O34219 Maternal care for unspecified type scar from previous cesarean delivery: Secondary | ICD-10-CM | POA: Insufficient documentation

## 2011-11-26 DIAGNOSIS — O358XX0 Maternal care for other (suspected) fetal abnormality and damage, not applicable or unspecified: Secondary | ICD-10-CM | POA: Insufficient documentation

## 2011-11-26 DIAGNOSIS — O10019 Pre-existing essential hypertension complicating pregnancy, unspecified trimester: Secondary | ICD-10-CM | POA: Insufficient documentation

## 2011-11-26 DIAGNOSIS — Z8279 Family history of other congenital malformations, deformations and chromosomal abnormalities: Secondary | ICD-10-CM | POA: Insufficient documentation

## 2011-11-26 DIAGNOSIS — O169 Unspecified maternal hypertension, unspecified trimester: Secondary | ICD-10-CM

## 2011-11-26 DIAGNOSIS — O09529 Supervision of elderly multigravida, unspecified trimester: Secondary | ICD-10-CM | POA: Insufficient documentation

## 2011-11-26 DIAGNOSIS — Z363 Encounter for antenatal screening for malformations: Secondary | ICD-10-CM | POA: Insufficient documentation

## 2011-11-26 DIAGNOSIS — Z713 Dietary counseling and surveillance: Secondary | ICD-10-CM | POA: Insufficient documentation

## 2011-11-26 DIAGNOSIS — O99810 Abnormal glucose complicating pregnancy: Secondary | ICD-10-CM | POA: Insufficient documentation

## 2011-11-26 DIAGNOSIS — Z1389 Encounter for screening for other disorder: Secondary | ICD-10-CM | POA: Insufficient documentation

## 2011-11-26 NOTE — Progress Notes (Signed)
Genetic Counseling  High-Risk Gestation Note  Appointment Date:  11/26/2011 Referred By: Francoise Ceo, MD Date of Birth:  10-16-1970    Pregnancy History: [id number] Estimated Date of Delivery: 04/13/12 Estimated Gestational Age: [redacted]w[redacted]d Attending: Rema Fendt, MD   Ms. Stacey Christian was seen for genetic counseling because of a maternal age of 41 y.o..     She was counseled regarding maternal age and the association with risk for chromosome conditions due to nondisjunction with aging of the ova.   We reviewed chromosomes, nondisjunction, and the associated 1 in 10 risk for fetal aneuploidy related to a maternal age of 41 y.o. at [redacted]w[redacted]d gestation.  She was counseled that the risk for aneuploidy decreases as gestational age increases, accounting for those pregnancies which spontaneously abort.  We specifically discussed Down syndrome (trisomy 21), trisomies 35 and 36, and sex chromosome aneuploidies (47,XXX and 47,XXY) including the common features and prognoses of each.   We reviewed other available screening options including Quad screen, noninvasive prenatal testing (NIPT), and detailed ultrasound. She understands that screening tests are used to modify a patient's a priori risk for aneuploidy, typically based on age.  This estimate provides a pregnancy specific risk assessment.  Specifically, we discussed that NIPT analyzes cell free fetal DNA found in the maternal circulation. This test is not diagnostic for chromosome conditions, but can provide information regarding the presence or absence of extra fetal DNA for chromosomes 13, 18, 21, X, and Y, and missing fetal DNA for chromosome X and Y (Turner syndrome). Thus, it would not identify or rule out all genetic conditions. The reported detection rate is greater than 99% for Trisomy 21, greater than 98% for Trisomy 18, and is approximately 80% (8 out of 10) for Trisomy 13. The false positive rate is reported to be less than 0.1% for any of  these conditions.  In addition, we discussed that ~50-80% of fetuses with Down syndrome and up to 90-95% of fetuses with trisomy 18/13, when well visualized, have detectable anomalies or soft markers by detailed ultrasound (~18+ weeks gestation).   She was also counseled regarding diagnostic testing via amniocentesis.  We reviewed the approximate 1 in 300-500 risk for complications for amniocentesis, including spontaneous pregnancy loss. We discussed the risks, limitations, and benefits of each screening and testing option. After consideration of all the options, she elected to proceed with ultrasound only at the time of today's visit and declined Quad screen, NIPT, and amniocentesis. A complete ultrasound was performed today. 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  The ultrasound report will be sent under separate cover.   An absent or hypoplastic (undeveloped, or slightly smaller than expected) nasal bone is commonly a normal variation with no associated problems.  This may be a family trait and can be more common in some ethnic groups, such as the African American population.  However, an absent or hypoplastic nasal bone has been shown to increase the chance for Down syndrome in a pregnancy. Thus, the finding of absent nasal bone on ultrasound would increase the chance for Down syndrome above the patient's a priori age related risk to approximately 66%. We again reviewed the options of NIPT and amniocentesis. Ms. Stacey Christian declined additional screening or testing at this time. She planned to further consider NIPT and amniocentesis and contact our office should she elect to pursue either of these testing options.    Ms. Stacey Christian was provided with written information regarding cystic fibrosis (  CF) including the carrier frequency and incidence in the Hispanic population, the availability of carrier testing and prenatal diagnosis if indicated.  In addition, we  discussed that CF is routinely screened for as part of the [facility] newborn screening panel.  She declined testing today.   Both family histories were reviewed and found to be contributory for a brother of the patient with a congenital heart defect. He had surgical correction at age 65 years and is otherwise healthy. Congenital heart defects occur in approximately 1% of pregnancies.  Congenital heart defects may occur due to multifactorial influences, chromosomal abnormalities, genetic syndromes or environmental exposures.  Isolated heart defects are generally multifactorial.  Given the reported family history and assuming multifactorial inheritance, the risk for a congenital heart defect in the current pregnancy, a second degree relative to the affected individual, would be approximately 1-2%. Targeted ultrasound and fetal echocardiogram are available to assess fetal heart in more detail.   The patient also reported that the father of the pregnancy has a maternal half-sister who had two children who died in infancy: a son who died at age 779 months, and a daughter who died at age 77 hours. She has two sons are currently healthy. The patient had very limited information regarding the underlying cause of death for these relatives and their specific medical concerns. We discussed that without additional information regarding an underlying cause, we are unable to assess whether or not this history would have implications for relatives.   Ms. Stacey Christian also reported that she and the father of the pregnancy are fourth degree relatives. Her mother and his father are maternal half-siblings. We discussed that children born to a consanguineous couple are at increased risk for genetic health problems.  This increase in risk is related to the possibility of passing on recessive genes. We explained that every person carries approximately 7-10 non-working genes that when received in a double dose results in recessive  genetic conditions.  In general, unrelated couples have a relatively low risk of having a child with a recessive condition because the likelihood of both parents carrying the same non-working recessive gene is very low.  However, when a couple is related, they have inherited some of their genetic information from the same family member, which leads to an increased chance that they may carry the same recessive gene and have a child with a recessive condition.  For first cousin once removed unions (which is the same degree relation as the patient and her partner), the risk to have a child with a birth defect, mental retardation, or genetic condition is increased approximately 1% above the general population risk (3-5%).  We reviewed chromosomes, genes, and recessive inheritance in detail. There are no known genetic conditions in the family. We reviewed the newborn screening in  assesses for several autosomal recessive inherited conditions at birth. Without further information regarding the provided family history, an accurate genetic risk cannot be calculated. Further genetic counseling is warranted if more information is obtained.  Ms. Clydette Privitera denied exposure to environmental toxins or chemical agents. She denied the use of alcohol, tobacco or street drugs. She denied significant viral illnesses during the course of her pregnancy. Her medical and surgical histories were contributory for hypertension, for which she currently takes medication and for diabetes mellitus. She met with Diabetic Educator at the time of today's visit. Please see separate note for detailed discussion.    I counseled Ms. [name] regarding the above risks  and available options.  The approximate face-to-face time with the genetic counselor was 40 minutes.  Quinn Plowman, MS,  Certified Genetic Counselor 11/26/2011

## 2011-11-26 NOTE — Progress Notes (Signed)
Stacey Christian was seen for ultrasound appointment today.  Please see AS-OBGYN report for details.  

## 2011-11-26 NOTE — ED Notes (Signed)
Diabetes Education:41 y/o lady with a history of GDM with last pregnancy 3.5 years ago, who remained diabetic with a diagnosis of type 2 diabetes, was seen to day with a pregnancy of 20 weeks.  Her blood glucose was controlled with Metformin which she stopped taking when she discovered she was pregnant .  She was being seen at [facility] for her diabetes and hypertension when she was not pregnant.  She is a G6 A2 P3.  Her EDD is 04/13/2012.  Ht: 62 in WT: 222.5 lb. Current meds:  Unknown BP medication and Prenatal vitamins..  At her last pregnancy, she was seen in the High Risk Pregnancy Clinic at [facility].  We reviewed the diet for GDM.  Provided the handout "Nutrition, Diabetes, and Pregnancy."  Reviewed carb counting and provided an exchange booklet.  She is fluent in [place] and requested [place] handouts.  She is uninsured and I referred her to the [employer] meter from [place].  She is to obtain the meter, strips and lancets.  She is to check a fastin, and 2 hr post meal blood glucose levels.  Was provided the recording sheets and is to call Jae Dire on Tuesday the results of her blood glucose readings.  She has my business card and is to call with questions or issues.  Maggie Deserie Dirks, RN, RD, CDE.

## 2011-11-27 ENCOUNTER — Other Ambulatory Visit: Payer: Self-pay | Admitting: Obstetrics

## 2011-12-23 ENCOUNTER — Other Ambulatory Visit: Payer: Self-pay | Admitting: Obstetrics and Gynecology

## 2011-12-23 DIAGNOSIS — O09529 Supervision of elderly multigravida, unspecified trimester: Secondary | ICD-10-CM

## 2011-12-23 DIAGNOSIS — O24419 Gestational diabetes mellitus in pregnancy, unspecified control: Secondary | ICD-10-CM

## 2011-12-24 ENCOUNTER — Ambulatory Visit (HOSPITAL_COMMUNITY)
Admission: RE | Admit: 2011-12-24 | Discharge: 2011-12-24 | Disposition: A | Discharge status: Home / Self Care | Payer: Self-pay | Source: Ambulatory Visit | Attending: Obstetrics | Admitting: Obstetrics

## 2011-12-24 DIAGNOSIS — I10 Essential (primary) hypertension: Secondary | ICD-10-CM

## 2011-12-24 DIAGNOSIS — O24419 Gestational diabetes mellitus in pregnancy, unspecified control: Secondary | ICD-10-CM

## 2011-12-24 DIAGNOSIS — O09529 Supervision of elderly multigravida, unspecified trimester: Secondary | ICD-10-CM

## 2011-12-24 DIAGNOSIS — Z843 Family history of consanguinity: Secondary | ICD-10-CM

## 2011-12-29 ENCOUNTER — Telehealth (HOSPITAL_COMMUNITY): Payer: Self-pay | Admitting: *Deleted

## 2011-12-29 ENCOUNTER — Other Ambulatory Visit: Payer: Self-pay

## 2011-12-29 DIAGNOSIS — O09529 Supervision of elderly multigravida, unspecified trimester: Secondary | ICD-10-CM

## 2011-12-29 DIAGNOSIS — Z843 Family history of consanguinity: Secondary | ICD-10-CM

## 2011-12-29 NOTE — Telephone Encounter (Signed)
Pt called in blood sugars from past week.  Reviewed with Dr. Sherrie George.  Will begin pt on glyburide 1.25mg  po q12 hours.  Called prescription into [facility] pharmacy per pt request.

## 2012-01-11 ENCOUNTER — Other Ambulatory Visit: Payer: Self-pay

## 2012-01-12 ENCOUNTER — Telehealth (HOSPITAL_COMMUNITY): Payer: Self-pay | Admitting: *Deleted

## 2012-01-12 ENCOUNTER — Other Ambulatory Visit (HOSPITAL_COMMUNITY): Payer: Self-pay | Admitting: Obstetrics

## 2012-01-12 DIAGNOSIS — IMO0002 Reserved for concepts with insufficient information to code with codable children: Secondary | ICD-10-CM

## 2012-01-12 DIAGNOSIS — Z843 Family history of consanguinity: Secondary | ICD-10-CM

## 2012-01-12 DIAGNOSIS — O09529 Supervision of elderly multigravida, unspecified trimester: Secondary | ICD-10-CM

## 2012-01-12 NOTE — Telephone Encounter (Signed)
Pt called back with blood sugars from Monday, Sunday and Friday.  See scanned documents.  Pt states yesterday she felt shaky in the morning after taking pill.  She had not eaten breakfast yet.  Discussed with pt to take pill with food.  Pt verbalized understanding.  While on phone scheduled pt for fetal echo on Monday 10/7 at 2pm.  Pt will call back if unable to make appointment due to work.

## 2012-01-12 NOTE — Telephone Encounter (Signed)
Called pt to discuss pt's blood sugars and glyburide.  Pt was not at home and did not have sugars available. Will be home in about 20 mins and will call back with sugars.

## 2012-01-18 ENCOUNTER — Other Ambulatory Visit (HOSPITAL_COMMUNITY): Payer: Self-pay | Admitting: Obstetrics

## 2012-01-18 ENCOUNTER — Other Ambulatory Visit (HOSPITAL_COMMUNITY): Payer: Self-pay

## 2012-01-18 ENCOUNTER — Ambulatory Visit (HOSPITAL_COMMUNITY)
Admission: RE | Admit: 2012-01-18 | Discharge: 2012-01-18 | Disposition: A | Discharge status: Home / Self Care | Payer: Self-pay | Source: Ambulatory Visit | Attending: Obstetrics | Admitting: Obstetrics

## 2012-01-18 DIAGNOSIS — O358XX0 Maternal care for other (suspected) fetal abnormality and damage, not applicable or unspecified: Secondary | ICD-10-CM | POA: Insufficient documentation

## 2012-01-18 DIAGNOSIS — IMO0002 Reserved for concepts with insufficient information to code with codable children: Secondary | ICD-10-CM

## 2012-01-18 DIAGNOSIS — O24919 Unspecified diabetes mellitus in pregnancy, unspecified trimester: Secondary | ICD-10-CM | POA: Insufficient documentation

## 2012-01-18 DIAGNOSIS — Z1389 Encounter for screening for other disorder: Secondary | ICD-10-CM | POA: Insufficient documentation

## 2012-01-18 DIAGNOSIS — O10019 Pre-existing essential hypertension complicating pregnancy, unspecified trimester: Secondary | ICD-10-CM | POA: Insufficient documentation

## 2012-01-18 DIAGNOSIS — Z363 Encounter for antenatal screening for malformations: Secondary | ICD-10-CM | POA: Insufficient documentation

## 2012-01-21 ENCOUNTER — Other Ambulatory Visit: Payer: Self-pay

## 2012-01-21 ENCOUNTER — Encounter (HOSPITAL_COMMUNITY): Payer: Self-pay

## 2012-01-21 ENCOUNTER — Ambulatory Visit (HOSPITAL_COMMUNITY)
Admission: RE | Admit: 2012-01-21 | Discharge: 2012-01-21 | Disposition: A | Discharge status: Home / Self Care | Payer: Self-pay | Source: Ambulatory Visit | Attending: Obstetrics | Admitting: Obstetrics

## 2012-01-21 VITALS — BP 148/75 | HR 89 | Wt 227.0 lb

## 2012-01-21 DIAGNOSIS — O09529 Supervision of elderly multigravida, unspecified trimester: Secondary | ICD-10-CM

## 2012-01-21 DIAGNOSIS — O10019 Pre-existing essential hypertension complicating pregnancy, unspecified trimester: Secondary | ICD-10-CM | POA: Insufficient documentation

## 2012-01-21 DIAGNOSIS — E119 Type 2 diabetes mellitus without complications: Secondary | ICD-10-CM

## 2012-01-21 DIAGNOSIS — O99810 Abnormal glucose complicating pregnancy: Secondary | ICD-10-CM

## 2012-01-21 DIAGNOSIS — O24919 Unspecified diabetes mellitus in pregnancy, unspecified trimester: Secondary | ICD-10-CM | POA: Insufficient documentation

## 2012-01-21 DIAGNOSIS — Z363 Encounter for antenatal screening for malformations: Secondary | ICD-10-CM | POA: Insufficient documentation

## 2012-01-21 DIAGNOSIS — Z843 Family history of consanguinity: Secondary | ICD-10-CM

## 2012-01-21 DIAGNOSIS — O358XX0 Maternal care for other (suspected) fetal abnormality and damage, not applicable or unspecified: Secondary | ICD-10-CM | POA: Insufficient documentation

## 2012-01-21 DIAGNOSIS — I10 Essential (primary) hypertension: Secondary | ICD-10-CM

## 2012-01-21 DIAGNOSIS — O24419 Gestational diabetes mellitus in pregnancy, unspecified control: Secondary | ICD-10-CM

## 2012-01-21 DIAGNOSIS — Z1389 Encounter for screening for other disorder: Secondary | ICD-10-CM | POA: Insufficient documentation

## 2012-01-21 DIAGNOSIS — O34219 Maternal care for unspecified type scar from previous cesarean delivery: Secondary | ICD-10-CM | POA: Insufficient documentation

## 2012-01-21 NOTE — Progress Notes (Signed)
Ms. Malli Valk  had an ultrasound appointment today.  Please see [facility]-OB/GYN report for details.  Comments There is an active singleton fetus with no apparent dysmorphic features on today's routine anatomic re-examination.  The biometry suggests a fetus with an EFW at the approximately 50th percentile for gestational age.   My review of glycemic measures demonstrates elevated postprandial measures in over half of the values this past week.  Impression Active singleton fetus. Normal interval growth. Normal amniotic fluid volume GDMA2   Recommendations 1. Repeat interval growth assessment by ultrasound was scheduled for your patient in 4 weeks. 2. I advised the patient to double her glyburide from 1.25mg  po bid to 2.5mg  po bid.  An accompanying prescription was written and provided for your patient. 3. I recommend initiation of twice weekly NSTs and weekly AFIs at [redacted] weeks gestational age. 4. Follow as clinically indicated.  Rogelia Boga, MD, MS, FACOG Assistant Professor Section of Maternal-Fetal Medicine [facility]

## 2012-01-21 NOTE — Progress Notes (Signed)
MFM in office, brief consultation.  By way of consultation, I met with Ms. Roanna Raider and explained the rationale behind testing for and treating gestational diabetes. I outlined the risks of poorly controlled GDM, including abnormal fetal growth, difficult labor and vaginal delivery, increased risk of cesarean section, and the neonatal metabolic complications including hypoglycemia and the potential for delayed pulmonary maturity.  We also discussed the management of gestational diabetes, with an emphasis on diet and activity/exercise as means of controlling blood sugars.  I detailed the usual schedule of glucose monitoring, with fingerstick testing in the fasting state, and two hours after each meal.  I gave her the usual target blood glucose values of <95 mg/dl fasting and <409 mg/dl at two hours postprandial.  Finally, I went over a protocol for fetal testing which includes serial ultrasounds for fetal growth and twice weekly non-stress tests after 32 weeks for patients with medication requirements, macrosomia, or polyhydramnios.   Because my review of her glycemic measures demonstrates elevated postprandial values, I increased her glyburide from 1.25 mg po bid to 2.5 mg po bid.  An accompanying prescription was written for her.  Before leaving, she scheduled a follow-up ultrasound in 4 weeks.  Rogelia Boga, MD, MS, [place]

## 2012-01-25 NOTE — Progress Notes (Signed)
Dear  Colleagues,  I had the pleasure of seeing Stacey Christian for consultation in the Fetal Heart Program at [facility]. As you know, she is a 41 y.o. G3P1 currently at [redacted] weeks gestation referred for fetal cardiology evaluation secondary to advanced maternal age and maternal diabetes. This pregnancy was conceived naturally. No indications for genetic testing at this point. She has not experienced any significant intercurrent illnesses or hospitalizations. Currently, her glycemic control is moderate (glyburide recently increased due to some ongoing elevated blood sugars). I could not find if she has had a current HgA1C in her records (nor can I see if this was performed in first trimester). She denies any exposure to tobacco, alcohol or recreational drugs. She has no complaints today. All systems reviewed and negative other than listed in HPI.  There is no family history of congenital heart disease, birth defects, genetic abnormalities, arrhythmia, pacemakers or sudden cardiac death.    I personally performed and reviewed a complete Fetal echocardiogram with Doppler assessment today. Please refer to a copy of the final report.  Although there were limited acoustic windows secondary to maternal body habitus we did not identify any significant intracardiac abnormalities.  There was normal Doppler patterns in the umbilical vessels, ductus venosus and middle cerebral artery. There was a normal fetal heart rate and rhythm. Lastly, there was no evidence for LV hypertrophy at this point.  I have communicated this information to the patient and have counseled her accordingly. We also discussed that fetal echocardiography may not be able to detect minor abnormalities such as small ventricular septal defects, subtle valve abnormalities or mild coarctation of the aorta. Nor can it predict postnatal persistence of normal fetal structures such as a patent foramen ovale or patent  ductus arteriosus.  I do not feel that a prenatal follow-up appointment is necessary for your patient in the Fetal Heart Program at [facility] but I am glad to see her back if you have any persistent concerns or if new clinical problems arise. I also do not feel that a post-natal echocardiogram is necessary after delivery unless indicated by clinical conditions. Please do not hesitate to contact me with any questions or concerns.  Thank you for involving me in the care of your patient today.   Sincerely,  Massie Bougie, MD Pediatric Cardiology [facility]  Total Time: 30 minues. Time of Counseling/Coordinating care: 20 minutes.

## 2012-02-04 ENCOUNTER — Telehealth (HOSPITAL_COMMUNITY): Payer: Self-pay | Admitting: *Deleted

## 2012-02-04 DIAGNOSIS — Z843 Family history of consanguinity: Secondary | ICD-10-CM

## 2012-02-04 DIAGNOSIS — O09529 Supervision of elderly multigravida, unspecified trimester: Secondary | ICD-10-CM

## 2012-02-04 NOTE — Telephone Encounter (Signed)
Called pt on # [phone] after 2pm as pt requested.  No answer and no availability of voicemail.  Will try again next week.

## 2012-02-16 ENCOUNTER — Other Ambulatory Visit: Payer: Self-pay

## 2012-02-18 ENCOUNTER — Ambulatory Visit (HOSPITAL_COMMUNITY)
Admission: RE | Admit: 2012-02-18 | Discharge: 2012-02-18 | Disposition: A | Discharge status: Home / Self Care | Payer: Self-pay | Source: Ambulatory Visit | Attending: Obstetrics | Admitting: Obstetrics

## 2012-02-18 ENCOUNTER — Other Ambulatory Visit (HOSPITAL_COMMUNITY): Payer: Self-pay | Admitting: Obstetrics

## 2012-02-18 VITALS — BP 141/79 | HR 94 | Wt 233.0 lb

## 2012-02-18 DIAGNOSIS — O34219 Maternal care for unspecified type scar from previous cesarean delivery: Secondary | ICD-10-CM | POA: Insufficient documentation

## 2012-02-18 DIAGNOSIS — O24919 Unspecified diabetes mellitus in pregnancy, unspecified trimester: Secondary | ICD-10-CM | POA: Insufficient documentation

## 2012-02-18 DIAGNOSIS — I10 Essential (primary) hypertension: Secondary | ICD-10-CM

## 2012-02-18 DIAGNOSIS — Z843 Family history of consanguinity: Secondary | ICD-10-CM

## 2012-02-18 DIAGNOSIS — O10019 Pre-existing essential hypertension complicating pregnancy, unspecified trimester: Secondary | ICD-10-CM | POA: Insufficient documentation

## 2012-02-18 DIAGNOSIS — E119 Type 2 diabetes mellitus without complications: Secondary | ICD-10-CM

## 2012-02-18 DIAGNOSIS — O09529 Supervision of elderly multigravida, unspecified trimester: Secondary | ICD-10-CM

## 2012-02-18 NOTE — Progress Notes (Signed)
Ryane Konieczny  was seen today for an ultrasound appointment.  See full report in [facility]-OB/GYN.  Impression: IUP at 32+1 weeks Estimated fetal weight today at 84th %tile Subjectively increased amniotic fluid volume The fetus is active with BPP of 8/8  Forgot to bring BSs - to call in early next week.  Recommendations: Recommend follow up growth scan in 4 weeks 2x weekly antepartum fetal testing (2x weekly NSTs/ weekly AFI)  Alpha Gula, MD

## 2012-02-18 NOTE — ED Notes (Signed)
Pt did not bring blood sugar log today.  Explained to pt that we will be seeing her twice a week and to bring her sugars with her each time.  Pt verbalized understanding.

## 2012-02-22 ENCOUNTER — Other Ambulatory Visit: Payer: Self-pay

## 2012-02-23 ENCOUNTER — Ambulatory Visit (HOSPITAL_COMMUNITY)
Admission: RE | Admit: 2012-02-23 | Discharge: 2012-02-23 | Disposition: A | Discharge status: Home / Self Care | Payer: Self-pay | Source: Ambulatory Visit | Attending: Obstetrics | Admitting: Obstetrics

## 2012-02-23 VITALS — BP 130/67 | HR 94

## 2012-02-23 DIAGNOSIS — O10019 Pre-existing essential hypertension complicating pregnancy, unspecified trimester: Secondary | ICD-10-CM | POA: Insufficient documentation

## 2012-02-23 DIAGNOSIS — O24919 Unspecified diabetes mellitus in pregnancy, unspecified trimester: Secondary | ICD-10-CM | POA: Insufficient documentation

## 2012-02-23 DIAGNOSIS — O34219 Maternal care for unspecified type scar from previous cesarean delivery: Secondary | ICD-10-CM | POA: Insufficient documentation

## 2012-02-23 DIAGNOSIS — O09529 Supervision of elderly multigravida, unspecified trimester: Secondary | ICD-10-CM

## 2012-02-23 DIAGNOSIS — Z843 Family history of consanguinity: Secondary | ICD-10-CM

## 2012-02-26 ENCOUNTER — Ambulatory Visit (HOSPITAL_COMMUNITY)
Admission: RE | Admit: 2012-02-26 | Discharge: 2012-02-26 | Disposition: A | Discharge status: Home / Self Care | Payer: Self-pay | Source: Ambulatory Visit | Attending: Obstetrics | Admitting: Obstetrics

## 2012-02-26 ENCOUNTER — Encounter (HOSPITAL_COMMUNITY): Payer: Self-pay

## 2012-02-26 VITALS — BP 148/85 | HR 102 | Wt 234.2 lb

## 2012-02-26 DIAGNOSIS — O34219 Maternal care for unspecified type scar from previous cesarean delivery: Secondary | ICD-10-CM | POA: Insufficient documentation

## 2012-02-26 DIAGNOSIS — I10 Essential (primary) hypertension: Secondary | ICD-10-CM

## 2012-02-26 DIAGNOSIS — Z843 Family history of consanguinity: Secondary | ICD-10-CM

## 2012-02-26 DIAGNOSIS — O10019 Pre-existing essential hypertension complicating pregnancy, unspecified trimester: Secondary | ICD-10-CM | POA: Insufficient documentation

## 2012-02-26 DIAGNOSIS — O09529 Supervision of elderly multigravida, unspecified trimester: Secondary | ICD-10-CM

## 2012-02-26 DIAGNOSIS — O24919 Unspecified diabetes mellitus in pregnancy, unspecified trimester: Secondary | ICD-10-CM | POA: Insufficient documentation

## 2012-02-26 DIAGNOSIS — E119 Type 2 diabetes mellitus without complications: Secondary | ICD-10-CM

## 2012-03-01 ENCOUNTER — Ambulatory Visit (HOSPITAL_COMMUNITY)
Admission: RE | Admit: 2012-03-01 | Discharge: 2012-03-01 | Disposition: A | Discharge status: Home / Self Care | Payer: Self-pay | Source: Ambulatory Visit | Attending: Obstetrics | Admitting: Obstetrics

## 2012-03-01 DIAGNOSIS — Z843 Family history of consanguinity: Secondary | ICD-10-CM

## 2012-03-01 DIAGNOSIS — O09529 Supervision of elderly multigravida, unspecified trimester: Secondary | ICD-10-CM | POA: Insufficient documentation

## 2012-03-04 ENCOUNTER — Other Ambulatory Visit (HOSPITAL_COMMUNITY): Payer: Self-pay | Admitting: Maternal and Fetal Medicine

## 2012-03-04 ENCOUNTER — Ambulatory Visit (HOSPITAL_COMMUNITY)
Admission: RE | Admit: 2012-03-04 | Discharge: 2012-03-04 | Disposition: A | Discharge status: Home / Self Care | Payer: Self-pay | Source: Ambulatory Visit | Attending: Obstetrics | Admitting: Obstetrics

## 2012-03-04 DIAGNOSIS — I10 Essential (primary) hypertension: Secondary | ICD-10-CM

## 2012-03-04 DIAGNOSIS — O09529 Supervision of elderly multigravida, unspecified trimester: Secondary | ICD-10-CM

## 2012-03-04 DIAGNOSIS — E119 Type 2 diabetes mellitus without complications: Secondary | ICD-10-CM

## 2012-03-04 DIAGNOSIS — O24919 Unspecified diabetes mellitus in pregnancy, unspecified trimester: Secondary | ICD-10-CM | POA: Insufficient documentation

## 2012-03-04 DIAGNOSIS — O34219 Maternal care for unspecified type scar from previous cesarean delivery: Secondary | ICD-10-CM | POA: Insufficient documentation

## 2012-03-04 DIAGNOSIS — O10019 Pre-existing essential hypertension complicating pregnancy, unspecified trimester: Secondary | ICD-10-CM | POA: Insufficient documentation

## 2012-03-04 DIAGNOSIS — Z843 Family history of consanguinity: Secondary | ICD-10-CM

## 2012-03-04 DIAGNOSIS — O289 Unspecified abnormal findings on antenatal screening of mother: Secondary | ICD-10-CM | POA: Insufficient documentation

## 2012-03-04 LAB — HEMOGLOBIN A1C: Mean Plasma Glucose: 131 mg/dL — ABNORMAL HIGH (ref <117)

## 2012-03-04 NOTE — Progress Notes (Signed)
Stacey Christian  was seen today for an ultrasound appointment.  See full report in AS-OB/GYN.  Impression: Single IUP at 34 2/7 weeks BPP 8/10 (-2 for non reactive NST) Previous ultrasound showed AC > 97th %tile.   Polyhydramnios noted (AFI 30 cm)  Recommendations: Continue 2x weekly antepartum fetal testing - NSTs with weekly AFI HbA1C ordered - suspect poorly controlled diabetes despite reported fingerstick values Follow up growth scan in 2 weeks  Alpha Gula, MD

## 2012-03-04 NOTE — ED Notes (Signed)
HGB A1C drawn today.

## 2012-03-08 ENCOUNTER — Ambulatory Visit (HOSPITAL_COMMUNITY)
Admission: RE | Admit: 2012-03-08 | Discharge: 2012-03-08 | Disposition: A | Discharge status: Home / Self Care | Payer: Self-pay | Source: Ambulatory Visit | Attending: Obstetrics | Admitting: Obstetrics

## 2012-03-08 VITALS — BP 148/78 | HR 93 | Wt 238.5 lb

## 2012-03-08 DIAGNOSIS — Z843 Family history of consanguinity: Secondary | ICD-10-CM

## 2012-03-08 DIAGNOSIS — O09529 Supervision of elderly multigravida, unspecified trimester: Secondary | ICD-10-CM | POA: Insufficient documentation

## 2012-03-08 NOTE — ED Notes (Signed)
Reviewed Hgb A1c with Dr. Sherrie George.  Pt forgot blood sugars today.  No changes to meds.

## 2012-03-11 ENCOUNTER — Other Ambulatory Visit (HOSPITAL_COMMUNITY): Payer: Self-pay | Admitting: Maternal and Fetal Medicine

## 2012-03-11 ENCOUNTER — Ambulatory Visit (HOSPITAL_COMMUNITY)
Admission: RE | Admit: 2012-03-11 | Discharge: 2012-03-11 | Disposition: A | Discharge status: Home / Self Care | Payer: Self-pay | Source: Ambulatory Visit | Attending: Obstetrics | Admitting: Obstetrics

## 2012-03-11 DIAGNOSIS — I10 Essential (primary) hypertension: Secondary | ICD-10-CM

## 2012-03-11 DIAGNOSIS — O09529 Supervision of elderly multigravida, unspecified trimester: Secondary | ICD-10-CM

## 2012-03-11 DIAGNOSIS — O24919 Unspecified diabetes mellitus in pregnancy, unspecified trimester: Secondary | ICD-10-CM | POA: Insufficient documentation

## 2012-03-11 DIAGNOSIS — O10019 Pre-existing essential hypertension complicating pregnancy, unspecified trimester: Secondary | ICD-10-CM | POA: Insufficient documentation

## 2012-03-11 DIAGNOSIS — E119 Type 2 diabetes mellitus without complications: Secondary | ICD-10-CM

## 2012-03-11 DIAGNOSIS — Z843 Family history of consanguinity: Secondary | ICD-10-CM

## 2012-03-11 DIAGNOSIS — O34219 Maternal care for unspecified type scar from previous cesarean delivery: Secondary | ICD-10-CM | POA: Insufficient documentation

## 2012-03-11 NOTE — Progress Notes (Signed)
Ms. Stacey Christian  had an NST appointment today.    Result: Reactive NST. Marland Kitchen  Rogelia Boga, MD, MS, FACOG Assistant Professor Section of Maternal-Fetal Medicine [facility]

## 2012-03-11 NOTE — Progress Notes (Signed)
Stacey Christian  had an ultrasound appointment today.  Please see AS-OB/GYN report for details.  Comments There is an active singleton fetus with no apparent dysmorphic features on today's routine sonographic re-examination.   Impression Active singleton fetus. Today's AFI is consistent with persistent polyhydramnios  Recommendations 1. Repeat interval growth assessment by ultrasound was scheduled for your patient next week 2. Continue to follow as clinically indicated.  Rogelia Boga, MD, MS, Evern Core

## 2012-03-15 ENCOUNTER — Ambulatory Visit (HOSPITAL_COMMUNITY)
Admission: RE | Admit: 2012-03-15 | Discharge: 2012-03-15 | Disposition: A | Discharge status: Home / Self Care | Payer: Self-pay | Source: Ambulatory Visit | Attending: Obstetrics | Admitting: Obstetrics

## 2012-03-15 DIAGNOSIS — O24919 Unspecified diabetes mellitus in pregnancy, unspecified trimester: Secondary | ICD-10-CM | POA: Insufficient documentation

## 2012-03-15 DIAGNOSIS — Z843 Family history of consanguinity: Secondary | ICD-10-CM

## 2012-03-15 DIAGNOSIS — O10019 Pre-existing essential hypertension complicating pregnancy, unspecified trimester: Secondary | ICD-10-CM | POA: Insufficient documentation

## 2012-03-15 DIAGNOSIS — O09529 Supervision of elderly multigravida, unspecified trimester: Secondary | ICD-10-CM

## 2012-03-15 NOTE — ED Notes (Signed)
Pt did not bring blood sugars with her to today's visit.  States she left them on the table.  Instructed pt to bring them on Friday.  Pt verbalized understanding.

## 2012-03-16 ENCOUNTER — Other Ambulatory Visit: Payer: Self-pay | Admitting: Obstetrics

## 2012-03-18 ENCOUNTER — Ambulatory Visit (HOSPITAL_COMMUNITY)
Admission: RE | Admit: 2012-03-18 | Discharge: 2012-03-18 | Disposition: A | Discharge status: Home / Self Care | Payer: Self-pay | Source: Ambulatory Visit | Attending: Obstetrics | Admitting: Obstetrics

## 2012-03-18 ENCOUNTER — Other Ambulatory Visit (HOSPITAL_COMMUNITY): Payer: Self-pay | Admitting: Maternal and Fetal Medicine

## 2012-03-18 ENCOUNTER — Ambulatory Visit (HOSPITAL_COMMUNITY): Admission: RE | Admit: 2012-03-18 | Payer: Self-pay | Source: Ambulatory Visit

## 2012-03-18 VITALS — BP 146/76 | HR 98 | Wt 241.0 lb

## 2012-03-18 DIAGNOSIS — Z843 Family history of consanguinity: Secondary | ICD-10-CM

## 2012-03-18 DIAGNOSIS — O34219 Maternal care for unspecified type scar from previous cesarean delivery: Secondary | ICD-10-CM | POA: Insufficient documentation

## 2012-03-18 DIAGNOSIS — I10 Essential (primary) hypertension: Secondary | ICD-10-CM

## 2012-03-18 DIAGNOSIS — O358XX0 Maternal care for other (suspected) fetal abnormality and damage, not applicable or unspecified: Secondary | ICD-10-CM | POA: Insufficient documentation

## 2012-03-18 DIAGNOSIS — E119 Type 2 diabetes mellitus without complications: Secondary | ICD-10-CM

## 2012-03-18 DIAGNOSIS — O10019 Pre-existing essential hypertension complicating pregnancy, unspecified trimester: Secondary | ICD-10-CM | POA: Insufficient documentation

## 2012-03-18 DIAGNOSIS — O24919 Unspecified diabetes mellitus in pregnancy, unspecified trimester: Secondary | ICD-10-CM | POA: Insufficient documentation

## 2012-03-18 DIAGNOSIS — O09529 Supervision of elderly multigravida, unspecified trimester: Secondary | ICD-10-CM

## 2012-03-18 NOTE — Progress Notes (Signed)
Ms. Stacey Christian was seen for ultrasound appointment today.  Please see AS-OBGYN report for details.

## 2012-03-22 ENCOUNTER — Ambulatory Visit (HOSPITAL_COMMUNITY)
Admission: RE | Admit: 2012-03-22 | Discharge: 2012-03-22 | Disposition: A | Discharge status: Home / Self Care | Payer: Self-pay | Source: Ambulatory Visit | Attending: Obstetrics | Admitting: Obstetrics

## 2012-03-22 DIAGNOSIS — O09529 Supervision of elderly multigravida, unspecified trimester: Secondary | ICD-10-CM | POA: Insufficient documentation

## 2012-03-22 DIAGNOSIS — O99891 Other specified diseases and conditions complicating pregnancy: Secondary | ICD-10-CM | POA: Insufficient documentation

## 2012-03-22 DIAGNOSIS — Z843 Family history of consanguinity: Secondary | ICD-10-CM | POA: Insufficient documentation

## 2012-03-22 DIAGNOSIS — Z8279 Family history of other congenital malformations, deformations and chromosomal abnormalities: Secondary | ICD-10-CM | POA: Insufficient documentation

## 2012-03-24 ENCOUNTER — Encounter (HOSPITAL_COMMUNITY): Payer: Self-pay

## 2012-03-24 ENCOUNTER — Other Ambulatory Visit (HOSPITAL_COMMUNITY): Payer: Self-pay | Admitting: Obstetrics

## 2012-03-24 DIAGNOSIS — O24419 Gestational diabetes mellitus in pregnancy, unspecified control: Secondary | ICD-10-CM

## 2012-03-25 ENCOUNTER — Other Ambulatory Visit: Payer: Self-pay

## 2012-03-25 ENCOUNTER — Ambulatory Visit (HOSPITAL_COMMUNITY)
Admission: RE | Admit: 2012-03-25 | Discharge: 2012-03-25 | Disposition: A | Discharge status: Home / Self Care | Payer: Self-pay | Source: Ambulatory Visit | Attending: Obstetrics | Admitting: Obstetrics

## 2012-03-25 VITALS — BP 140/76 | HR 90 | Wt 238.5 lb

## 2012-03-25 DIAGNOSIS — Z843 Family history of consanguinity: Secondary | ICD-10-CM

## 2012-03-25 DIAGNOSIS — O24919 Unspecified diabetes mellitus in pregnancy, unspecified trimester: Secondary | ICD-10-CM | POA: Insufficient documentation

## 2012-03-25 DIAGNOSIS — O09529 Supervision of elderly multigravida, unspecified trimester: Secondary | ICD-10-CM

## 2012-03-25 DIAGNOSIS — O34219 Maternal care for unspecified type scar from previous cesarean delivery: Secondary | ICD-10-CM | POA: Insufficient documentation

## 2012-03-25 DIAGNOSIS — O24419 Gestational diabetes mellitus in pregnancy, unspecified control: Secondary | ICD-10-CM

## 2012-03-25 DIAGNOSIS — O358XX0 Maternal care for other (suspected) fetal abnormality and damage, not applicable or unspecified: Secondary | ICD-10-CM | POA: Insufficient documentation

## 2012-03-25 DIAGNOSIS — O10019 Pre-existing essential hypertension complicating pregnancy, unspecified trimester: Secondary | ICD-10-CM | POA: Insufficient documentation

## 2012-03-25 NOTE — Progress Notes (Signed)
Stacey Christian was seen for ultrasound appointment today.  Please see AS-OBGYN report for details.  

## 2012-03-29 ENCOUNTER — Ambulatory Visit (HOSPITAL_COMMUNITY)
Admission: RE | Admit: 2012-03-29 | Discharge: 2012-03-29 | Disposition: A | Discharge status: Home / Self Care | Payer: Self-pay | Source: Ambulatory Visit | Attending: Obstetrics | Admitting: Obstetrics

## 2012-03-29 ENCOUNTER — Other Ambulatory Visit (HOSPITAL_COMMUNITY): Payer: Self-pay | Admitting: Obstetrics

## 2012-03-29 DIAGNOSIS — O10019 Pre-existing essential hypertension complicating pregnancy, unspecified trimester: Secondary | ICD-10-CM | POA: Insufficient documentation

## 2012-03-29 DIAGNOSIS — O358XX0 Maternal care for other (suspected) fetal abnormality and damage, not applicable or unspecified: Secondary | ICD-10-CM

## 2012-03-29 DIAGNOSIS — O34219 Maternal care for unspecified type scar from previous cesarean delivery: Secondary | ICD-10-CM | POA: Insufficient documentation

## 2012-03-29 DIAGNOSIS — O24919 Unspecified diabetes mellitus in pregnancy, unspecified trimester: Secondary | ICD-10-CM

## 2012-03-29 DIAGNOSIS — Z843 Family history of consanguinity: Secondary | ICD-10-CM

## 2012-03-29 DIAGNOSIS — O09529 Supervision of elderly multigravida, unspecified trimester: Secondary | ICD-10-CM

## 2012-03-30 ENCOUNTER — Encounter (HOSPITAL_COMMUNITY): Payer: Self-pay

## 2012-04-01 ENCOUNTER — Ambulatory Visit (HOSPITAL_COMMUNITY): Admission: RE | Admit: 2012-04-01 | Payer: Self-pay | Source: Ambulatory Visit

## 2012-04-01 ENCOUNTER — Encounter (HOSPITAL_COMMUNITY)
Admission: RE | Admit: 2012-04-01 | Discharge: 2012-04-01 | Disposition: A | Discharge status: Home / Self Care | Payer: Medicaid Other | Source: Ambulatory Visit | Attending: Obstetrics | Admitting: Obstetrics

## 2012-04-01 ENCOUNTER — Other Ambulatory Visit (HOSPITAL_COMMUNITY): Payer: Self-pay | Admitting: Obstetrics

## 2012-04-01 ENCOUNTER — Encounter (HOSPITAL_COMMUNITY): Payer: Self-pay

## 2012-04-01 ENCOUNTER — Ambulatory Visit (HOSPITAL_COMMUNITY)
Admission: RE | Admit: 2012-04-01 | Discharge: 2012-04-01 | Disposition: A | Discharge status: Home / Self Care | Payer: Self-pay | Source: Ambulatory Visit | Attending: Obstetrics | Admitting: Obstetrics

## 2012-04-01 VITALS — BP 136/86 | HR 83 | Resp 18 | Ht 63.0 in | Wt 236.0 lb

## 2012-04-01 VITALS — BP 120/65 | HR 82 | Wt 238.5 lb

## 2012-04-01 DIAGNOSIS — O09529 Supervision of elderly multigravida, unspecified trimester: Secondary | ICD-10-CM

## 2012-04-01 DIAGNOSIS — O34219 Maternal care for unspecified type scar from previous cesarean delivery: Secondary | ICD-10-CM

## 2012-04-01 DIAGNOSIS — Z843 Family history of consanguinity: Secondary | ICD-10-CM

## 2012-04-01 DIAGNOSIS — O24919 Unspecified diabetes mellitus in pregnancy, unspecified trimester: Secondary | ICD-10-CM | POA: Insufficient documentation

## 2012-04-01 DIAGNOSIS — O358XX0 Maternal care for other (suspected) fetal abnormality and damage, not applicable or unspecified: Secondary | ICD-10-CM

## 2012-04-01 DIAGNOSIS — O10019 Pre-existing essential hypertension complicating pregnancy, unspecified trimester: Secondary | ICD-10-CM | POA: Insufficient documentation

## 2012-04-01 HISTORY — DX: Anemia, unspecified: D64.9

## 2012-04-01 HISTORY — DX: Heartburn: R12

## 2012-04-01 HISTORY — DX: Hyperlipidemia, unspecified: E78.5

## 2012-04-01 HISTORY — DX: Other specified pregnancy related conditions, unspecified trimester: O26.899

## 2012-04-01 LAB — BASIC METABOLIC PANEL
BUN: 10 mg/dL (ref 6–23)
CO2: 19 mEq/L (ref 19–32)
Calcium: 8.6 mg/dL (ref 8.4–10.5)
Creatinine, Ser: 0.49 mg/dL — ABNORMAL LOW (ref 0.50–1.10)
GFR calc non Af Amer: >90 mL/min (ref >90)
Glucose, Bld: 126 mg/dL — ABNORMAL HIGH (ref 70–99)

## 2012-04-01 LAB — DIFFERENTIAL
Basophils Absolute: 0 10*3/uL (ref 0.0–0.1)
Basophils Relative: 0 % (ref 0–1)
Eosinophils Absolute: 0.3 10*3/uL (ref 0.0–0.7)
Eosinophils Relative: 3 % (ref 0–5)
Monocytes Absolute: 0.4 10*3/uL (ref 0.1–1.0)

## 2012-04-01 LAB — CBC
HCT: 37.7 % (ref 36.0–46.0)
MCHC: 33.7 g/dL (ref 30.0–36.0)
MCV: 93.3 fL (ref 78.0–100.0)
Platelets: 236 10*3/uL (ref 150–400)
RDW: 14.6 % (ref 11.5–15.5)
WBC: 8.8 10*3/uL (ref 4.0–10.5)

## 2012-04-01 LAB — RPR: RPR Ser Ql: NONREACTIVE

## 2012-04-01 LAB — TYPE AND SCREEN: Antibody Screen: NEGATIVE

## 2012-04-01 NOTE — ED Notes (Signed)
Pt did not bring sugars with her today.

## 2012-04-01 NOTE — Patient Instructions (Addendum)
   Your procedure is scheduled on:  Thursday, Dec 26  Enter through the [employer] [facility] at:  11 am Pick up the phone at the desk and dial [phone] and inform us of your arrival.  Please call this number if you have any problems the morning of surgery: [phone]  Remember: Do not eat food after midnight: Wednesday Do not drink clear liquids after: 830 am Thursday Take these medicines the morning of surgery with a SIP OF WATER:  Patient to withhold glyburide Wednesday night's dose and Thursday morning dose.  Patient takes BP at night - will take Wednesday night.   Do not wear jewelry, make-up, or FINGER nail polish No metal in your hair or on your body. Do not wear lotions, powders, perfumes. You may wear deodorant.  Please use your CHG wash as directed prior to surgery.  Do not shave anywhere for at least 12 hours prior to first CHG shower.  Do not bring valuables to the hospital. Contacts, dentures or bridgework may not be worn into surgery.  Leave suitcase in the car. After Surgery it may be brought to your room. For patients being admitted to the hospital, checkout time is 11:00am the day of discharge.  Home with FOB Douglass Rivers.

## 2012-04-02 NOTE — H&P (Signed)
NAMEDEAVEN, BARRON NO.:  [id number]  MEDICAL RECORD NO.:  [id number]  LOCATION:  PERIO                         FACILITY:  [facility]  PHYSICIAN:  Kathreen Cosier, M.D.DATE OF BIRTH:  1970-04-22  DATE OF ADMISSION:  03/14/2012 DATE OF DISCHARGE:                             HISTORY & PHYSICAL   DATE OF SURGERY:  April 07, 2012.  HISTORY OF PRESENT ILLNESS:  The patient is a 41 year old, gravida 6, para 2-1-2-3, who had a previous C-section and now at term.  Her due date is April 14, 2012, and she is in for repeat C-section and tubal ligation.  During this pregnancy, the patient has had a history of hypertension and she has been on Norvasc 5 mg p.o. daily.  Blood pressures have been normal.  Her diabetes has been followed with MFM and she is on glyburide 5 mg p.o. daily in the a.m. and 2.5 mg p.o. in the p.m. with good control.  She has been followed with nonstress tests twice a week at [facility] and biophysical profiles.  PAST SURGICAL HISTORY:  C-section x1.  PAST MEDICAL HISTORY:  Negative for diabetes when she is not pregnant and she has taken lisinopril in the past for hypertension.  SOCIAL HISTORY:  Negative.  SYSTEM REVIEW:  Noncontributory.  PHYSICAL EXAMINATION:  GENERAL:  Well-developed female, not in labor. HEENT:  Negative. LUNGS:  Clear to P and A. HEART:  Regular rhythm.  No murmurs, no gallops. BREASTS:  No masses. ABDOMEN:  Term. EXTREMITIES:  Negative.          ______________________________ Kathreen Cosier, M.D.     BAM/MEDQ  D:  04/01/2012  T:  04/02/2012  Job:  [id number]

## 2012-04-05 ENCOUNTER — Ambulatory Visit (HOSPITAL_COMMUNITY)
Admission: RE | Admit: 2012-04-05 | Discharge: 2012-04-05 | Disposition: A | Discharge status: Home / Self Care | Payer: Self-pay | Source: Ambulatory Visit | Attending: Obstetrics | Admitting: Obstetrics

## 2012-04-05 DIAGNOSIS — O34219 Maternal care for unspecified type scar from previous cesarean delivery: Secondary | ICD-10-CM | POA: Insufficient documentation

## 2012-04-05 DIAGNOSIS — Z843 Family history of consanguinity: Secondary | ICD-10-CM

## 2012-04-05 DIAGNOSIS — O24919 Unspecified diabetes mellitus in pregnancy, unspecified trimester: Secondary | ICD-10-CM | POA: Insufficient documentation

## 2012-04-05 DIAGNOSIS — O358XX0 Maternal care for other (suspected) fetal abnormality and damage, not applicable or unspecified: Secondary | ICD-10-CM | POA: Insufficient documentation

## 2012-04-05 DIAGNOSIS — O10019 Pre-existing essential hypertension complicating pregnancy, unspecified trimester: Secondary | ICD-10-CM | POA: Insufficient documentation

## 2012-04-05 DIAGNOSIS — O09529 Supervision of elderly multigravida, unspecified trimester: Secondary | ICD-10-CM

## 2012-04-07 ENCOUNTER — Encounter (HOSPITAL_COMMUNITY): Payer: Self-pay | Admitting: *Deleted

## 2012-04-07 ENCOUNTER — Encounter (HOSPITAL_COMMUNITY)
Admission: RE | Disposition: A | Discharge status: Home / Self Care | Payer: Self-pay | Source: Ambulatory Visit | Attending: Obstetrics

## 2012-04-07 ENCOUNTER — Encounter (HOSPITAL_COMMUNITY): Payer: Self-pay | Admitting: Anesthesiology

## 2012-04-07 ENCOUNTER — Inpatient Hospital Stay (HOSPITAL_COMMUNITY): Payer: Medicaid Other | Admitting: Anesthesiology

## 2012-04-07 ENCOUNTER — Inpatient Hospital Stay (HOSPITAL_COMMUNITY)
Admission: RE | Admit: 2012-04-07 | Discharge: 2012-04-10 | DRG: 765 | Disposition: A | Discharge status: Home / Self Care | Payer: Medicaid Other | Source: Ambulatory Visit | Attending: Obstetrics | Admitting: Obstetrics

## 2012-04-07 DIAGNOSIS — Z302 Encounter for sterilization: Secondary | ICD-10-CM

## 2012-04-07 DIAGNOSIS — O09529 Supervision of elderly multigravida, unspecified trimester: Secondary | ICD-10-CM

## 2012-04-07 DIAGNOSIS — O99814 Abnormal glucose complicating childbirth: Secondary | ICD-10-CM | POA: Diagnosis present

## 2012-04-07 DIAGNOSIS — Z01812 Encounter for preprocedural laboratory examination: Secondary | ICD-10-CM

## 2012-04-07 DIAGNOSIS — O1002 Pre-existing essential hypertension complicating childbirth: Secondary | ICD-10-CM | POA: Diagnosis present

## 2012-04-07 DIAGNOSIS — O34219 Maternal care for unspecified type scar from previous cesarean delivery: Principal | ICD-10-CM | POA: Diagnosis present

## 2012-04-07 DIAGNOSIS — Z01818 Encounter for other preprocedural examination: Secondary | ICD-10-CM

## 2012-04-07 DIAGNOSIS — Z843 Family history of consanguinity: Secondary | ICD-10-CM

## 2012-04-07 LAB — PREPARE RBC (CROSSMATCH)

## 2012-04-07 SURGERY — Surgical Case
Anesthesia: Spinal | Site: Abdomen | Laterality: Bilateral | Wound class: Clean Contaminated

## 2012-04-07 MED ORDER — MENTHOL 3 MG MT LOZG: 1.0000 | LOZENGE | OROMUCOSAL | Status: DC | PRN

## 2012-04-07 MED ORDER — DIBUCAINE 1 % RE OINT: 1.0000 "application " | TOPICAL_OINTMENT | RECTAL | Status: DC | PRN

## 2012-04-07 MED ORDER — DIPHENHYDRAMINE HCL 50 MG/ML IJ SOLN: 12.5000 mg | INTRAMUSCULAR | Status: DC | PRN

## 2012-04-07 MED ORDER — DIPHENHYDRAMINE HCL 50 MG/ML IJ SOLN: 25.0000 mg | INTRAMUSCULAR | Status: DC | PRN

## 2012-04-07 MED ORDER — MEPERIDINE HCL 25 MG/ML IJ SOLN: 6.2500 mg | INTRAMUSCULAR | Status: DC | PRN

## 2012-04-07 MED ORDER — IBUPROFEN 600 MG PO TABS: 600.0000 mg | ORAL_TABLET | Freq: Four times a day (QID) | ORAL | Status: DC | PRN

## 2012-04-07 MED ORDER — ONDANSETRON HCL 4 MG/2ML IJ SOLN
4.0000 mg | Freq: Three times a day (TID) | INTRAMUSCULAR | Status: DC | PRN
  Filled 2012-04-07: qty 2

## 2012-04-07 MED ORDER — DIPHENHYDRAMINE HCL 25 MG PO CAPS: 25.0000 mg | ORAL_CAPSULE | Freq: Four times a day (QID) | ORAL | Status: DC | PRN

## 2012-04-07 MED ORDER — LACTATED RINGERS IV SOLN
INTRAVENOUS | Status: DC
  Administered 2012-04-07: 12:00:00 via INTRAVENOUS
  Administered 2012-04-07: 125 mL/h via INTRAVENOUS

## 2012-04-07 MED ORDER — ONDANSETRON HCL 4 MG/2ML IJ SOLN
INTRAMUSCULAR | Status: AC
  Filled 2012-04-07: qty 2

## 2012-04-07 MED ORDER — KETOROLAC TROMETHAMINE 30 MG/ML IJ SOLN: 30.0000 mg | Freq: Four times a day (QID) | INTRAMUSCULAR | Status: AC | PRN

## 2012-04-07 MED ORDER — MORPHINE SULFATE 0.5 MG/ML IJ SOLN
INTRAMUSCULAR | Status: AC
  Filled 2012-04-07: qty 10

## 2012-04-07 MED ORDER — CEFAZOLIN SODIUM-DEXTROSE 2-3 GM-% IV SOLR
2.0000 g | Freq: Once | INTRAVENOUS | Status: AC
  Administered 2012-04-07: 2 g via INTRAVENOUS

## 2012-04-07 MED ORDER — FENTANYL CITRATE 0.05 MG/ML IJ SOLN
INTRAMUSCULAR | Status: AC
  Filled 2012-04-07: qty 2

## 2012-04-07 MED ORDER — HYDROMORPHONE HCL PF 1 MG/ML IJ SOLN: 0.2500 mg | INTRAMUSCULAR | Status: DC | PRN

## 2012-04-07 MED ORDER — NALOXONE HCL 1 MG/ML IJ SOLN: 1.0000 ug/kg/h | INTRAVENOUS | Status: DC | PRN

## 2012-04-07 MED ORDER — ONDANSETRON HCL 4 MG/2ML IJ SOLN
4.0000 mg | INTRAMUSCULAR | Status: DC | PRN
  Administered 2012-04-07 (×2): 4 mg via INTRAVENOUS

## 2012-04-07 MED ORDER — NALBUPHINE HCL 10 MG/ML IJ SOLN: 5.0000 mg | INTRAMUSCULAR | Status: DC | PRN

## 2012-04-07 MED ORDER — LACTATED RINGERS IV SOLN: INTRAVENOUS | Status: DC | PRN

## 2012-04-07 MED ORDER — METOCLOPRAMIDE HCL 5 MG/ML IJ SOLN: 10.0000 mg | Freq: Three times a day (TID) | INTRAMUSCULAR | Status: DC | PRN

## 2012-04-07 MED ORDER — OXYTOCIN 10 UNIT/ML IJ SOLN
40.0000 [IU] | INTRAVENOUS | Status: DC | PRN
  Administered 2012-04-07: 40 [IU] via INTRAVENOUS

## 2012-04-07 MED ORDER — ONDANSETRON HCL 4 MG/2ML IJ SOLN
INTRAMUSCULAR | Status: DC | PRN
  Administered 2012-04-07: 4 mg via INTRAVENOUS

## 2012-04-07 MED ORDER — DIPHENHYDRAMINE HCL 25 MG PO CAPS: 25.0000 mg | ORAL_CAPSULE | ORAL | Status: DC | PRN

## 2012-04-07 MED ORDER — BUPIVACAINE IN DEXTROSE 0.75-8.25 % IT SOLN
INTRATHECAL | Status: DC | PRN
  Administered 2012-04-07: 12 mg via INTRATHECAL

## 2012-04-07 MED ORDER — NALOXONE HCL 0.4 MG/ML IJ SOLN: 0.4000 mg | INTRAMUSCULAR | Status: DC | PRN

## 2012-04-07 MED ORDER — MORPHINE SULFATE (PF) 0.5 MG/ML IJ SOLN
INTRAMUSCULAR | Status: DC | PRN
  Administered 2012-04-07: .2 mg via INTRATHECAL

## 2012-04-07 MED ORDER — OXYTOCIN 40 UNITS IN LACTATED RINGERS INFUSION - SIMPLE MED: 62.5000 mL/h | INTRAVENOUS | Status: AC

## 2012-04-07 MED ORDER — SIMETHICONE 80 MG PO CHEW
80.0000 mg | CHEWABLE_TABLET | Freq: Three times a day (TID) | ORAL | Status: DC
  Administered 2012-04-08 – 2012-04-10 (×8): 80 mg via ORAL

## 2012-04-07 MED ORDER — FENTANYL CITRATE 0.05 MG/ML IJ SOLN
INTRAMUSCULAR | Status: DC | PRN
  Administered 2012-04-07: 12.5 ug via INTRATHECAL

## 2012-04-07 MED ORDER — EPHEDRINE 5 MG/ML INJ
INTRAVENOUS | Status: AC
  Filled 2012-04-07: qty 10

## 2012-04-07 MED ORDER — SIMETHICONE 80 MG PO CHEW
80.0000 mg | CHEWABLE_TABLET | ORAL | Status: DC | PRN
  Administered 2012-04-09: 80 mg via ORAL

## 2012-04-07 MED ORDER — SCOPOLAMINE 1 MG/3DAYS TD PT72: 1.0000 | MEDICATED_PATCH | Freq: Once | TRANSDERMAL | Status: DC

## 2012-04-07 MED ORDER — SENNOSIDES-DOCUSATE SODIUM 8.6-50 MG PO TABS
2.0000 | ORAL_TABLET | Freq: Every day | ORAL | Status: DC
  Administered 2012-04-08 – 2012-04-09 (×2): 2 via ORAL

## 2012-04-07 MED ORDER — ONDANSETRON HCL 4 MG PO TABS: 4.0000 mg | ORAL_TABLET | ORAL | Status: DC | PRN

## 2012-04-07 MED ORDER — IBUPROFEN 600 MG PO TABS
600.0000 mg | ORAL_TABLET | Freq: Four times a day (QID) | ORAL | Status: DC
  Administered 2012-04-07 – 2012-04-10 (×11): 600 mg via ORAL
  Filled 2012-04-07 (×11): qty 1

## 2012-04-07 MED ORDER — CEFAZOLIN SODIUM-DEXTROSE 2-3 GM-% IV SOLR
INTRAVENOUS | Status: AC
  Filled 2012-04-07: qty 50

## 2012-04-07 MED ORDER — SCOPOLAMINE 1 MG/3DAYS TD PT72
1.0000 | MEDICATED_PATCH | Freq: Once | TRANSDERMAL | Status: DC
  Administered 2012-04-07: 1.5 mg via TRANSDERMAL

## 2012-04-07 MED ORDER — TETANUS-DIPHTH-ACELL PERTUSSIS 5-2.5-18.5 LF-MCG/0.5 IM SUSP: 0.5000 mL | Freq: Once | INTRAMUSCULAR | Status: DC

## 2012-04-07 MED ORDER — BUPIVACAINE HCL (PF) 0.25 % IJ SOLN
INTRAMUSCULAR | Status: DC | PRN
  Administered 2012-04-07: 30 mL

## 2012-04-07 MED ORDER — OXYCODONE-ACETAMINOPHEN 5-325 MG PO TABS
1.0000 | ORAL_TABLET | ORAL | Status: DC | PRN
  Administered 2012-04-08 – 2012-04-09 (×2): 1 via ORAL
  Filled 2012-04-07 (×2): qty 1

## 2012-04-07 MED ORDER — LACTATED RINGERS IV SOLN
INTRAVENOUS | Status: DC
  Administered 2012-04-07 – 2012-04-08 (×2): via INTRAVENOUS

## 2012-04-07 MED ORDER — LACTATED RINGERS IV SOLN
INTRAVENOUS | Status: DC | PRN
  Administered 2012-04-07: 13:00:00 via INTRAVENOUS

## 2012-04-07 MED ORDER — PHENYLEPHRINE 40 MCG/ML (10ML) SYRINGE FOR IV PUSH (FOR BLOOD PRESSURE SUPPORT)
PREFILLED_SYRINGE | INTRAVENOUS | Status: AC
  Filled 2012-04-07: qty 10

## 2012-04-07 MED ORDER — WITCH HAZEL-GLYCERIN EX PADS: 1.0000 "application " | MEDICATED_PAD | CUTANEOUS | Status: DC | PRN

## 2012-04-07 MED ORDER — ZOLPIDEM TARTRATE 5 MG PO TABS: 5.0000 mg | ORAL_TABLET | Freq: Every evening | ORAL | Status: DC | PRN

## 2012-04-07 MED ORDER — LANOLIN HYDROUS EX OINT: 1.0000 "application " | TOPICAL_OINTMENT | CUTANEOUS | Status: DC | PRN

## 2012-04-07 MED ORDER — EPHEDRINE SULFATE 50 MG/ML IJ SOLN
INTRAMUSCULAR | Status: DC | PRN
  Administered 2012-04-07: 10 mg via INTRAVENOUS

## 2012-04-07 MED ORDER — PRENATAL MULTIVITAMIN CH
1.0000 | ORAL_TABLET | Freq: Every day | ORAL | Status: DC
  Administered 2012-04-08 – 2012-04-10 (×3): 1 via ORAL
  Filled 2012-04-07 (×3): qty 1

## 2012-04-07 MED ORDER — KETOROLAC TROMETHAMINE 60 MG/2ML IM SOLN
60.0000 mg | Freq: Once | INTRAMUSCULAR | Status: AC | PRN
  Filled 2012-04-07: qty 2

## 2012-04-07 MED ORDER — PHENYLEPHRINE HCL 10 MG/ML IJ SOLN
INTRAMUSCULAR | Status: DC | PRN
  Administered 2012-04-07: 40 ug via INTRAVENOUS
  Administered 2012-04-07: 80 ug via INTRAVENOUS
  Administered 2012-04-07 (×4): 40 ug via INTRAVENOUS

## 2012-04-07 MED ORDER — SCOPOLAMINE 1 MG/3DAYS TD PT72
MEDICATED_PATCH | TRANSDERMAL | Status: AC
  Administered 2012-04-07: 1.5 mg via TRANSDERMAL
  Filled 2012-04-07: qty 1

## 2012-04-07 MED ORDER — OXYTOCIN 10 UNIT/ML IJ SOLN
INTRAMUSCULAR | Status: AC
  Filled 2012-04-07: qty 4

## 2012-04-07 MED ORDER — SODIUM CHLORIDE 0.9 % IJ SOLN: 3.0000 mL | INTRAMUSCULAR | Status: DC | PRN

## 2012-04-07 MED ORDER — BUPIVACAINE HCL (PF) 0.25 % IJ SOLN
INTRAMUSCULAR | Status: AC
  Filled 2012-04-07: qty 30

## 2012-04-07 SURGICAL SUPPLY — 25 items
CLOTH BEACON ORANGE TIMEOUT ST (SAFETY) ×3 IMPLANT
DRAPE LG THREE QUARTER DISP (DRAPES) ×3 IMPLANT
DRSG OPSITE POSTOP 4X10 (GAUZE/BANDAGES/DRESSINGS) ×2 IMPLANT
DURAPREP 26ML APPLICATOR (WOUND CARE) ×3 IMPLANT
ELECT REM PT RETURN 9FT ADLT (ELECTROSURGICAL) ×3
ELECTRODE REM PT RTRN 9FT ADLT (ELECTROSURGICAL) ×2 IMPLANT
EXTRACTOR VACUUM M CUP 4 TUBE (SUCTIONS) ×2 IMPLANT
GLOVE BIO SURGEON STRL SZ8.5 (GLOVE) ×4 IMPLANT
GLOVE ECLIPSE 8.0 STRL XLNG CF (GLOVE) ×2 IMPLANT
GLOVE INDICATOR 7.0 STRL GRN (GLOVE) ×4 IMPLANT
GOWN PREVENTION PLUS LG XLONG (DISPOSABLE) ×4 IMPLANT
GOWN PREVENTION PLUS XLARGE (GOWN DISPOSABLE) ×2 IMPLANT
GOWN PREVENTION PLUS XXLARGE (GOWN DISPOSABLE) ×3 IMPLANT
NS IRRIG 1000ML POUR BTL (IV SOLUTION) ×3 IMPLANT
PACK C SECTION WH (CUSTOM PROCEDURE TRAY) ×3 IMPLANT
PAD OB MATERNITY 4.3X12.25 (PERSONAL CARE ITEMS) ×2 IMPLANT
SUT CHROMIC 0 CT 802H (SUTURE) ×3 IMPLANT
SUT CHROMIC 1 CTX 36 (SUTURE) ×6 IMPLANT
SUT CHROMIC 2 0 SH (SUTURE) ×3 IMPLANT
SUT GUT PLAIN 0 CT-3 TAN 27 (SUTURE) ×2 IMPLANT
SUT MON AB 4-0 PS1 27 (SUTURE) ×3 IMPLANT
SUT VIC AB 0 CTX 36 (SUTURE) ×6
SUT VIC AB 0 CTX36XBRD ANBCTRL (SUTURE) ×4 IMPLANT
TOWEL OR 17X24 6PK STRL BLUE (TOWEL DISPOSABLE) ×7 IMPLANT
TRAY FOLEY CATH 14FR (SET/KITS/TRAYS/PACK) ×3 IMPLANT

## 2012-04-07 NOTE — Anesthesia Preprocedure Evaluation (Signed)
Anesthesia Evaluation  Patient identified by MRN, date of birth, ID band Patient awake    Reviewed: Allergy & Precautions, H&P , Patient's Chart, lab work & pertinent test results  Airway Mallampati: II TM Distance: >3 FB Neck ROM: full    Dental No notable dental hx.    Pulmonary  breath sounds clear to auscultation  Pulmonary exam normal       Cardiovascular Exercise Tolerance: Good hypertension, On Medications Rhythm:regular Rate:Normal     Neuro/Psych    GI/Hepatic GERD-  ,  Endo/Other  diabetesMorbid obesity  Renal/GU      Musculoskeletal   Abdominal   Peds  Hematology   Anesthesia Other Findings   Reproductive/Obstetrics                           Anesthesia Physical Anesthesia Plan  ASA: III  Anesthesia Plan: Spinal   Post-op Pain Management:    Induction:   Airway Management Planned:   Additional Equipment:   Intra-op Plan:   Post-operative Plan:   Informed Consent: I have reviewed the patients History and Physical, chart, labs and discussed the procedure including the risks, benefits and alternatives for the proposed anesthesia with the patient or authorized representative who has indicated his/her understanding and acceptance.   Dental Advisory Given  Plan Discussed with: CRNA  Anesthesia Plan Comments: (Lab work confirmed with CRNA in room. Platelets okay. Discussed spinal anesthetic, and patient consents to the procedure:  included risk of possible headache,backache, failed block, allergic reaction, and nerve injury. This patient was asked if she had any questions or concerns before the procedure started. )        Anesthesia Quick Evaluation

## 2012-04-07 NOTE — Anesthesia Postprocedure Evaluation (Signed)
Anesthesia Post Note  Patient: Stacey Christian  Procedure(s) Performed: Procedure(s) (LRB): CESAREAN SECTION WITH BILATERAL TUBAL LIGATION (Bilateral)  Anesthesia type: Spinal  Patient location: PACU  Post pain: Pain level controlled  Post assessment: Post-op Vital signs reviewed  Last Vitals:  Filed Vitals:   04/07/12 1338  BP: 118/66  Pulse: 77  Temp:   Resp:     Post vital signs: Reviewed  Level of consciousness: awake  Complications: No apparent anesthesia complications

## 2012-04-07 NOTE — H&P (Signed)
  There has been no change in her history and physical since time of dictation in in a

## 2012-04-07 NOTE — Anesthesia Procedure Notes (Signed)
Spinal  Patient location during procedure: OR Start time: 04/07/2012 12:05 PM End time: 04/07/2012 12:08 PM Staffing Anesthesiologist: Sandrea Hughs Performed by: anesthesiologist  Preanesthetic Checklist Completed: patient identified, site marked, surgical consent, pre-op evaluation, timeout performed, IV checked, risks and benefits discussed and monitors and equipment checked Spinal Block Patient position: sitting Prep: DuraPrep Patient monitoring: heart rate, cardiac monitor, continuous pulse ox and blood pressure Approach: midline Location: L3-4 Injection technique: single-shot Needle Needle type: Sprotte  Needle gauge: 24 G Needle length: 9 cm Needle insertion depth: 5 cm Assessment Sensory level: T4

## 2012-04-07 NOTE — Op Note (Signed)
preop diagnosis previous cesarean section at term and multiparity Postop diagnosis repeat C-section and tubal ligation Anesthesia spinal Surgeon Dr. Gaynell Face and First assistant Dr. Coral Ceo Acedia patient placed on the operating table in the supine position abdomen prepped and draped data entered with a Foley catheter a transverse incision made to the old scar carried to the rectus fascia fascia cleaned and incised the length of the incision recti muscles retracted laterally peritoneum incised longitudinally transverse incision made in the visceroperitoneum above the bladder and the bladder mobilized inferiorly transverse low uterine incision made the fluid was clear patient delivered with a vacuum of a female Apgar 89 the placenta was anterior fundal removed manually and sent to labor and delivery uterine cavity clean with dry laps the uterine incision closed in one layer with continuous within normal on chromic hemostasis satisfactory bladder flap protected full chromic the right tube was grasped in the midportion with a Babcock clamp 0 pencil-tip is in the mesial salpinx below to 42 with left approximately 1 inch of tube was transected hemostasis satisfactory procedure done in a similar fashion the site lap and sponge counts correct abdomen closed in layers peritoneum continuous with of 0 chromic fascia continuous with of 0 Dexon skin shows a subcuticular stitch of 4-0 Monocryl blood loss was 1500 cc

## 2012-04-07 NOTE — Transfer of Care (Signed)
Immediate Anesthesia Transfer of Care Note  Patient: Stacey Christian  Procedure(s) Performed: Procedure(s) (LRB) with comments: CESAREAN SECTION (N/A) CESAREAN SECTION WITH BILATERAL TUBAL LIGATION (Bilateral)  Patient Location: PACU  Anesthesia Type:Spinal  Level of Consciousness: awake, alert  and oriented  Airway & Oxygen Therapy: Patient Spontanous Breathing  Post-op Assessment: Report given to PACU RN and Post -op Vital signs reviewed and stable  Post vital signs: stable  Complications: No apparent anesthesia complications

## 2012-04-08 ENCOUNTER — Encounter (HOSPITAL_COMMUNITY): Payer: Self-pay | Admitting: Obstetrics

## 2012-04-08 LAB — CBC
MCV: 94.1 fL (ref 78.0–100.0)
Platelets: 216 10*3/uL (ref 150–400)
RBC: 3.53 MIL/uL — ABNORMAL LOW (ref 3.87–5.11)
RDW: 15 % (ref 11.5–15.5)
WBC: 12.3 10*3/uL — ABNORMAL HIGH (ref 4.0–10.5)

## 2012-04-08 LAB — BIRTH TISSUE RECOVERY COLLECTION (PLACENTA DONATION)

## 2012-04-08 LAB — GLUCOSE, CAPILLARY: Glucose-Capillary: 80 mg/dL (ref 70–99)

## 2012-04-08 MED ORDER — INFLUENZA VIRUS VACC SPLIT PF IM SUSP
0.5000 mL | Freq: Once | INTRAMUSCULAR | Status: AC
  Administered 2012-04-08: 0.5 mL via INTRAMUSCULAR
  Filled 2012-04-08: qty 0.5

## 2012-04-08 NOTE — Progress Notes (Signed)
04/08/12  Spoke with staff nurse Cynda Acres about patient's blood sugars and previous diagnosis of diabetes.   Recommend that patient's blood sugars be checked before meals and at bedtime.  Start Novolog SENSITIVE correction scale AC & HS based on her blood sugars. Will continue to follow while in hospital.    Smith Mince RN BSN CDE  Inpatient Diabetes Program

## 2012-04-08 NOTE — Progress Notes (Signed)
Patient ID: Stacey Christian, female   DOB: Sep 03, 1970, 41 y.o.   MRN: [id number] Postop day 1 Vital signs normal incision clean and dry Lochia moderate Legs negative The check her GBS this morning

## 2012-04-08 NOTE — Anesthesia Postprocedure Evaluation (Signed)
  Anesthesia Post-op Note  Patient: Stacey Christian  Procedure(s) Performed: Procedure(s) (LRB) with comments: CESAREAN SECTION WITH BILATERAL TUBAL LIGATION (Bilateral)  Patient Location: Mother/Baby  Anesthesia Type:Spinal  Level of Consciousness: awake, alert  and oriented  Airway and Oxygen Therapy: Patient Spontanous Breathing  Post-op Pain: mild  Post-op Assessment: Patient's Cardiovascular Status Stable, Respiratory Function Stable, Patent Airway, No signs of Nausea or vomiting and Pain level controlled  Post-op Vital Signs: Reviewed  Complications: No apparent anesthesia complications

## 2012-04-08 NOTE — Progress Notes (Signed)
UR chart review completed.  

## 2012-04-08 NOTE — Addendum Note (Signed)
Addendum  created 04/08/12 0808 by Lincoln Brigham, CRNA   Modules edited:Notes Section

## 2012-04-09 DIAGNOSIS — O34219 Maternal care for unspecified type scar from previous cesarean delivery: Principal | ICD-10-CM | POA: Diagnosis not present

## 2012-04-09 LAB — HEPATITIS B SURFACE ANTIGEN: Hepatitis B Surface Ag: NEGATIVE

## 2012-04-09 NOTE — Progress Notes (Addendum)
Patient ID: Stacey Christian, female   DOB: 1970-04-22, 41 y.o.   MRN: [id number] Subjective: POD# 2 s/p Cesarean Delivery.  Indications: elective repeat  RH status/Rubella reviewed. Feeding: breast Patient reports tolerating PO.  Denies HA/SOB/C/P/N/V/dizziness.   Breast symptoms: no.  She reports vaginal bleeding as normal, without clots.  She is ambulating, urinating without difficulty.     Objective: Vital signs in last 24 hours: BP 123/77  Pulse 69  Temp 97.2 F (36.2 C) (Oral)  Resp 18  Ht 5\' 3"  (1.6 m)  Wt 108.183 kg (238 lb 8 oz)  BMI 42.25 kg/m2  SpO2 98%  LMP 07/08/2011  Breastfeeding? Unknown       Physical Exam:  General: alert CV: Regular rate and rhythm Resp: clear Abdomen: soft, nontender, normal bowel sounds Lochia: minimal Uterine Fundus: firm, below umbilicus, nontender Incision: dressing in place Ext: extremities normal, atraumatic, no cyanosis or edema  CBG (last 3)   Basename 04/09/12 0640 04/08/12 0629 04/07/12 1352  GLUCAP 109* 80 70     Basename 04/08/12 0545  HGB 11.1*  HCT 33.2*      Assessment/Plan: 41 y.o.  status post Cesarean section. POD# 2.   Doing well, stable.   H/O GDM, oral agent;  CBGs in range             Ambulate IS Routine post-op care  JACKSON-MOORE,Shandel Busic A 04/09/2012, 10:10 AM

## 2012-04-10 MED ORDER — OXYCODONE-ACETAMINOPHEN 5-325 MG PO TABS: 1.0000 | ORAL_TABLET | Freq: Four times a day (QID) | ORAL | Status: DC | PRN

## 2012-04-10 MED ORDER — PRENATAL MULTIVITAMIN CH: 1.0000 | ORAL_TABLET | Freq: Every morning | ORAL | Status: DC

## 2012-04-10 NOTE — Discharge Summary (Signed)
  Obstetric Discharge Summary Reason for Admission: cesarean section Prenatal Procedures: none Intrapartum Procedures: cesarean: low cervical, transverse and tubal ligation Postpartum Procedures: none Complications-Operative and Postpartum: none  Hemoglobin  Date Value Range Status  04/08/2012 11.1* 12.0 - 15.0 g/dL Final     HCT  Date Value Range Status  04/08/2012 33.2* 36.0 - 46.0 % Final    Physical Exam:  General: alert Lochia: appropriate Uterine: firm Incision: dressing C/D DVT Evaluation: No evidence of DVT seen on physical exam.  Discharge Diagnoses: Active Problems:  Previous cesarean delivery, delivered, with or without mention of antepartum condition   Discharge Information: Date: 04/10/2012 Activity: pelvic rest Diet: routine Medications:  Prior to Admission medications   Medication Sig Start Date End Date Taking? Authorizing Provider  oxyCODONE-acetaminophen (PERCOCET/ROXICET) 5-325 MG per tablet Take 1-2 tablets by mouth every 6 (six) hours as needed (moderate - severe pain). 04/10/12   Antionette Char, MD  Prenatal Vit-Fe Fumarate-FA (PRENATAL MULTIVITAMIN) TABS Take 1 tablet by mouth every morning. 04/10/12   Antionette Char, MD    Condition: stable Instructions: refer to routine discharge instructions Discharge to: home Follow-up Information    Follow up with MARSHALL,BERNARD A, MD. Schedule an appointment as soon as possible for a visit in 6 weeks.   Contact information:   [address] [place] [address] [phone]          Newborn Data: Live born  Information for the patient's newborn:  Nila, Winker Girl Marijayne [[id number]]  female ; APGAR (1 MIN): 8   APGAR (5 MINS): 9     Home with mother.  JACKSON-MOORE,Debbie Bellucci A 04/10/2012, 12:33 PM

## 2012-04-11 ENCOUNTER — Telehealth (HOSPITAL_COMMUNITY): Payer: Self-pay | Admitting: *Deleted

## 2012-04-11 LAB — TYPE AND SCREEN
Antibody Screen: NEGATIVE
Unit division: 0

## 2012-04-11 NOTE — Telephone Encounter (Signed)
Resolve episode 

## 2014-02-12 ENCOUNTER — Encounter (HOSPITAL_COMMUNITY): Payer: Self-pay | Admitting: Obstetrics

## 2014-04-20 IMAGING — US US OB FOLLOW-UP
1 series · 12 of 28 positions shown · non-contrast
Comparison: none

[Series 1: us ob follow-up · 0.30mm/px · 12 of 34 slices shown]
[im 2/34]
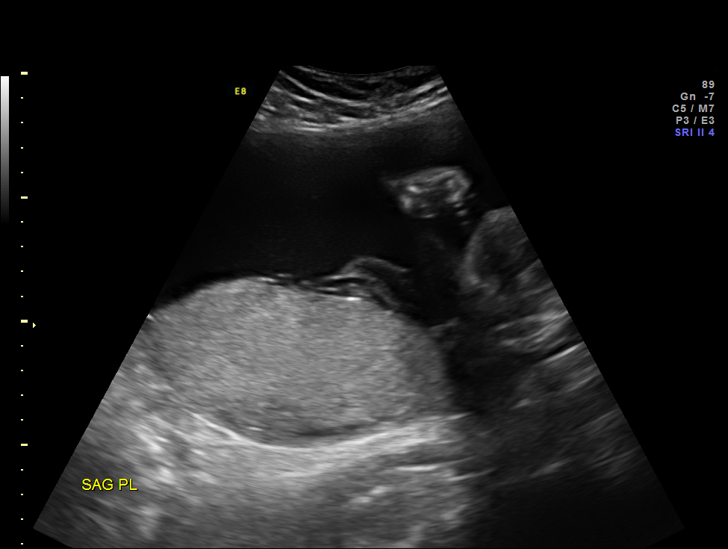
[im 4/34]
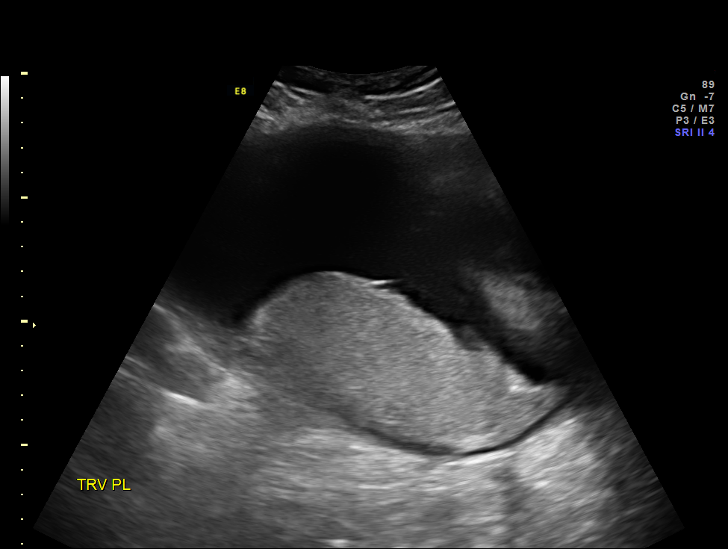
[im 7/34]
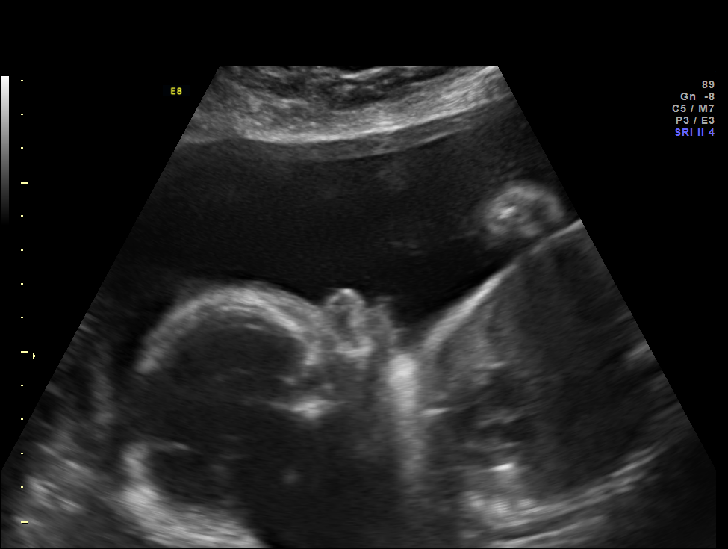
[im 10/34]
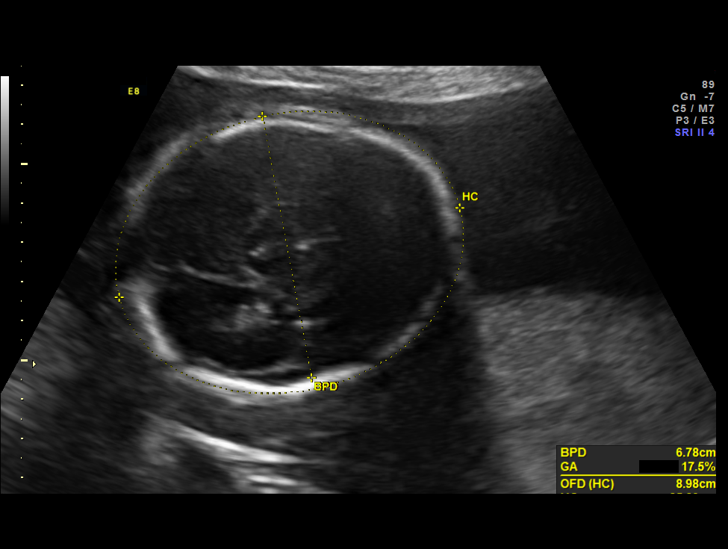
[im 13/34]
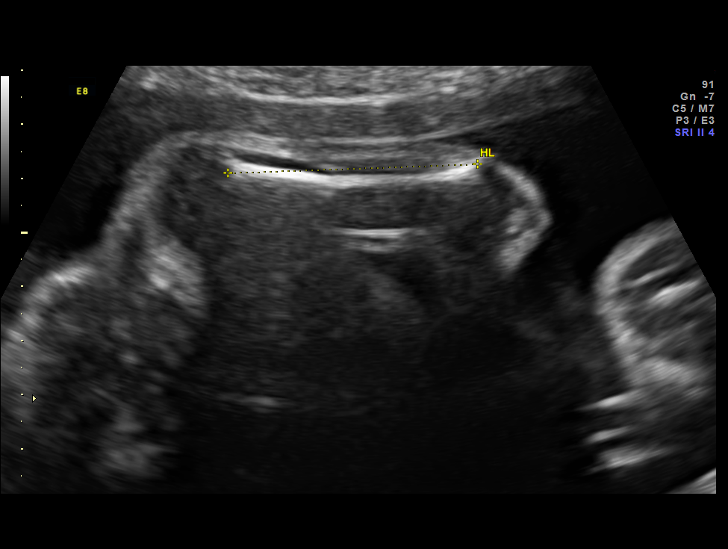
[im 15/34]
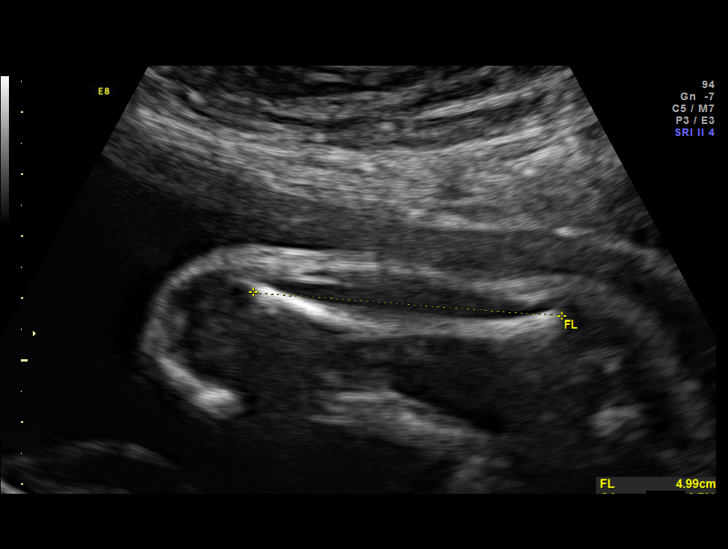
[im 19/34]
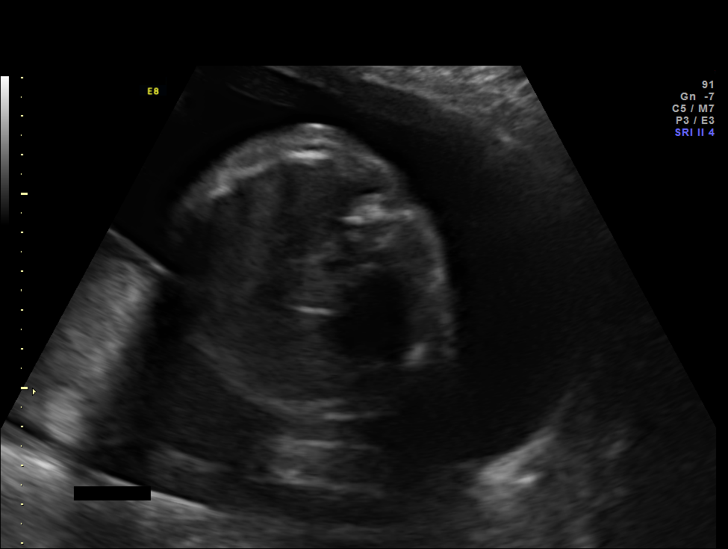
[im 21/34]
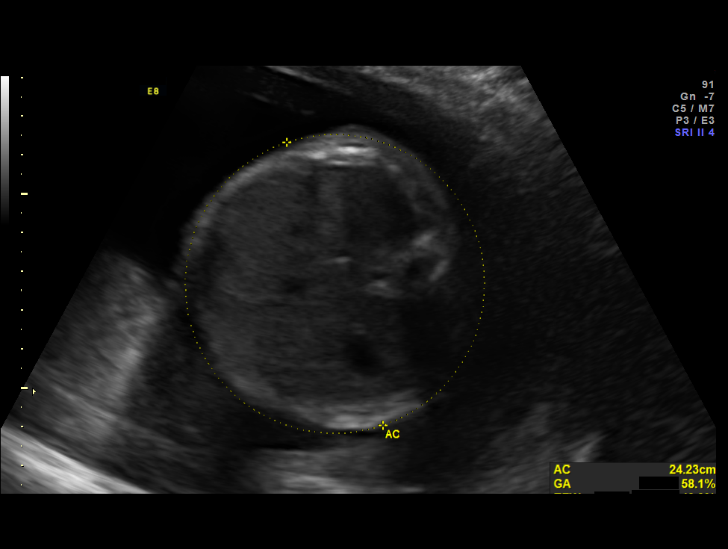
[im 24/34]
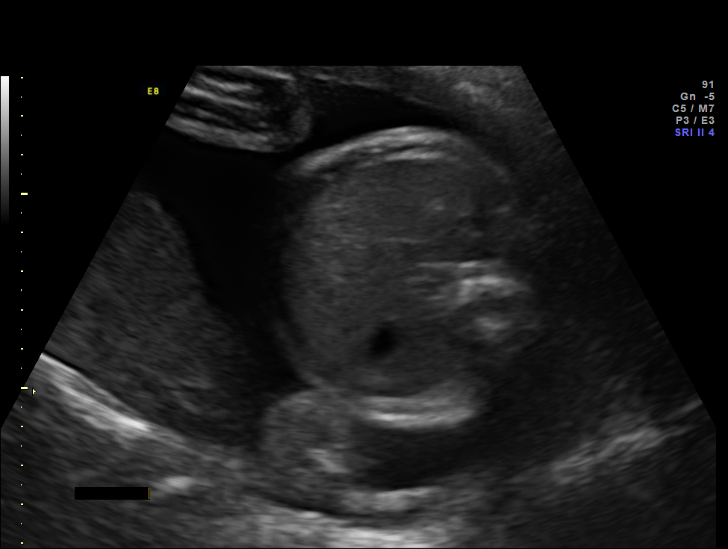
[im 27/34]
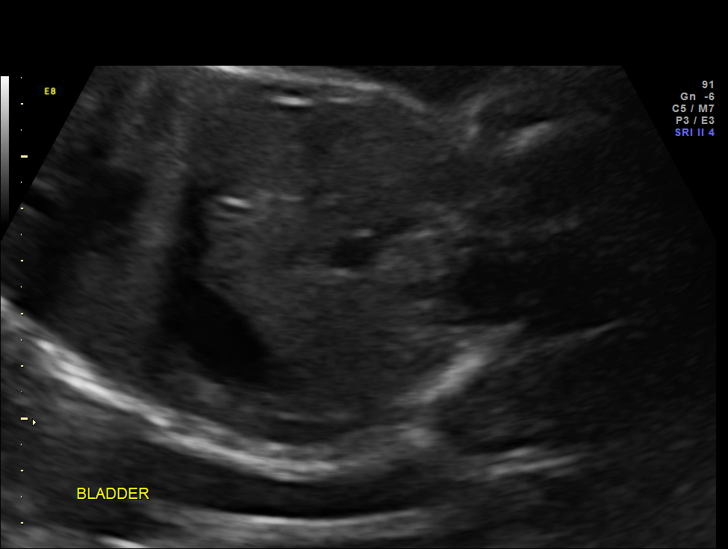
[im 30/34]
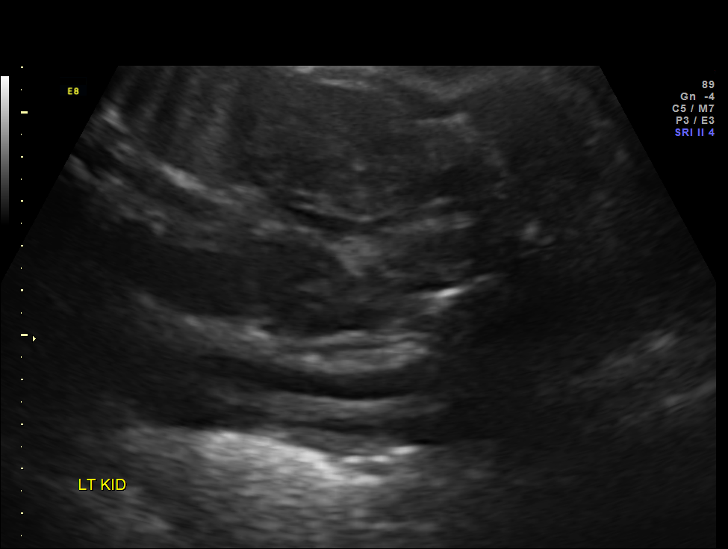
[im 32/34]
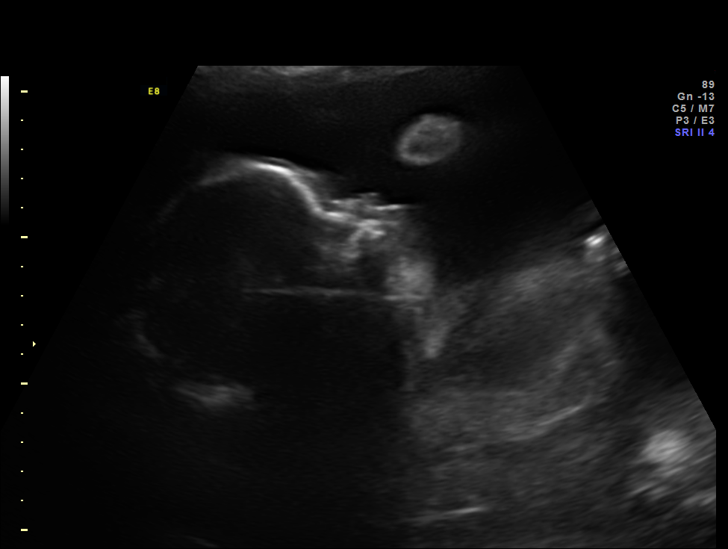

[12 of 28 positions shown; findings below may reference images not displayed]

OBSTETRICS REPORT
                      (Signed Final 01/21/2012 [DATE])

             TIGER

Service(s) Provided

 US OB FOLLOW UP                                       76816.1
Indications

 Assess Fetal Growth / Estimated Fetal Weight
 F/u detailed fetal anatomic survey (complete
 anatomy)
 Diabetes - Pregestational, Class B
 Hypertension - Chronic/Pre-existing
 Previous cesarean section
 Absent nasal bone
Fetal Evaluation

 Num Of Fetuses:    1
 Fetal Heart Rate:  147                          bpm
 Cardiac Activity:  Observed
 Presentation:      Cephalic
 Placenta:          Posterior, above cervical
                    os
 P. Cord            Previously Visualized
 Insertion:

 Amniotic Fluid
 AFI FV:      Subjectively within normal limits
                                             Larg Pckt:     6.8  cm
Biometry

 BPD:     68.2  mm     G. Age:  27w 3d                CI:         75.1   70 - 86
 OFD:     90.8  mm                                    FL/HC:      20.0   18.8 -

 HC:     254.7  mm     G. Age:  27w 5d       10  %    HC/AC:      1.04   1.05 -

 AC:     245.4  mm     G. Age:  28w 5d       62  %    FL/BPD:     74.6   71 - 87
 FL:      50.9  mm     G. Age:  27w 2d       15  %    FL/AC:      20.7   20 - 24
 HUM:     46.6  mm     G. Age:  27w 3d       31  %

 Est. FW:    6626  gm      2 lb 9 oz     50  %
Gestational Age

 LMP:           28w 1d        Date:  07/08/11                 EDD:   04/13/12
 U/S Today:     27w 5d                                        EDD:   04/16/12
 Best:          28w 1d     Det. By:  LMP  (07/08/11)          EDD:   04/13/12
Anatomy

 Cranium:          Previously seen        Ductal Arch:      Previously seen
 Fetal Cavum:      Previously seen        Diaphragm:        Appears normal
 Ventricles:       Appears normal         Stomach:          Appears normal, left
                                                            sided
 Choroid Plexus:   Previously seen        Abdomen:          Appears normal
 Cerebellum:       Previously seen        Abdominal Wall:   Appears nml (cord
                                                            insert, abd wall)
 Posterior Fossa:  Previously seen        Cord Vessels:     Previously seen
 Nuchal Fold:      Not applicable (>20    Kidneys:          Appear normal
                   wks GA)
 Face:             Orbits and profile     Bladder:          Appears normal
                   previously seen
 Lips:             Previously seen        Spine:            Previously seen
 Heart:            Previously seen        Lower             Previously seen
                                          Extremities:
 RVOT:             Previously seen        Upper             Previously seen
                                          Extremities:
 LVOT:             Previously seen        Limbs:            Previously seen
 Aortic Arch:      Not well visualized

 Other:  Nasal bone absent.
Comments

 There is an active singleton fetus with no apparent
 dysmorphic features on today's routine anatomic re-
 examination.
 The biometry suggests a fetus with an EFW at the
 approximately 50th percentile for gestational age.

 My review of glycemic measures demonstrates elevated
 postprandial measures in over half of the values this past
 week.
Impression

 Active singleton fetus.
 Normal interval growth.
 Normal amniotic fluid volume
 NHDPZ
Recommendations

 1. Repeat interval growth assessment by ultrasound was
 scheduled for your patient in 4 weeks.
 2. I advised the patient to double her glyburide from 1.25mg
 po bid to 2.5mg po bid.  An accompanying prescription was
 written and provided for your patient.
 3. I recommend initiation of twice weekly NSTs and weekly
 AFIs at 32 weeks gestational age.
 4. Follow as clinically indicated.

## 2018-03-29 ENCOUNTER — Other Ambulatory Visit (HOSPITAL_COMMUNITY): Payer: Self-pay | Admitting: *Deleted

## 2018-03-29 DIAGNOSIS — Z1231 Encounter for screening mammogram for malignant neoplasm of breast: Secondary | ICD-10-CM

## 2018-06-16 ENCOUNTER — Ambulatory Visit (HOSPITAL_COMMUNITY): Payer: Self-pay

## 2018-06-16 ENCOUNTER — Ambulatory Visit
Admission: RE | Admit: 2018-06-16 | Discharge: 2018-06-16 | Disposition: A | Discharge status: Home / Self Care | Payer: No Typology Code available for payment source | Source: Ambulatory Visit | Attending: Obstetrics and Gynecology | Admitting: Obstetrics and Gynecology

## 2018-06-16 DIAGNOSIS — Z1231 Encounter for screening mammogram for malignant neoplasm of breast: Secondary | ICD-10-CM

## 2019-07-13 ENCOUNTER — Ambulatory Visit: Payer: No Typology Code available for payment source | Attending: Internal Medicine

## 2019-07-13 DIAGNOSIS — Z23 Encounter for immunization: Secondary | ICD-10-CM

## 2019-07-13 NOTE — Progress Notes (Signed)
   Covid-19 Vaccination Clinic  Name:  Stacey Christian    MRN: [id number] DOB: [birth year]  07/13/2019  Stacey Christian was observed post Covid-19 immunization for 15 minutes without incident. She was provided with Vaccine Information Sheet and instruction to access the V-Safe system.   Stacey Christian was instructed to call 911 with any severe reactions post vaccine: Marland Kitchen Difficulty breathing  . Swelling of face and throat  . A fast heartbeat  . A bad rash all over body  . Dizziness and weakness   Immunizations Administered    Name Date Dose VIS Date Route   Pfizer COVID-19 Vaccine 07/13/2019 11:39 AM 0.3 mL 03/24/2019 Intramuscular   Manufacturer: [employer], [employer]   Lot: [id number]   NDC: [address]-9

## 2019-07-17 ENCOUNTER — Ambulatory Visit: Payer: No Typology Code available for payment source

## 2019-08-09 ENCOUNTER — Ambulatory Visit: Payer: No Typology Code available for payment source | Attending: Internal Medicine

## 2019-08-09 DIAGNOSIS — Z23 Encounter for immunization: Secondary | ICD-10-CM

## 2019-08-09 NOTE — Progress Notes (Signed)
   Covid-19 Vaccination Clinic  Name:  Stacey Christian    MRN: [id number] DOB: 31-Mar-1971  08/09/2019  [name] was observed post Covid-19 immunization for 15 minutes without incident. She was provided with Vaccine Information Sheet and instruction to access the V-Safe system.   [name] was instructed to call 911 with any severe reactions post vaccine: Marland Kitchen Difficulty breathing  . Swelling of face and throat  . A fast heartbeat  . A bad rash all over body  . Dizziness and weakness   Immunizations Administered    Name Date Dose VIS Date Route   Pfizer COVID-19 Vaccine 08/09/2019  2:53 PM 0.3 mL 06/07/2018 Intramuscular   Manufacturer: [employer], [employer]   Lot: [id number]   NDC: [address]-2

## 2019-11-16 ENCOUNTER — Other Ambulatory Visit: Payer: Self-pay | Admitting: Obstetrics and Gynecology

## 2019-11-16 DIAGNOSIS — Z1231 Encounter for screening mammogram for malignant neoplasm of breast: Secondary | ICD-10-CM

## 2019-12-21 ENCOUNTER — Ambulatory Visit
Admission: RE | Admit: 2019-12-21 | Discharge: 2019-12-21 | Disposition: A | Discharge status: Home / Self Care | Payer: No Typology Code available for payment source | Source: Ambulatory Visit | Attending: Obstetrics and Gynecology | Admitting: Obstetrics and Gynecology

## 2019-12-21 ENCOUNTER — Other Ambulatory Visit: Payer: Self-pay

## 2019-12-21 ENCOUNTER — Ambulatory Visit: Payer: Self-pay | Admitting: *Deleted

## 2019-12-21 VITALS — BP 120/74 | Temp 97.1°F | Wt 218.4 lb

## 2019-12-21 DIAGNOSIS — Z1239 Encounter for other screening for malignant neoplasm of breast: Secondary | ICD-10-CM

## 2019-12-21 DIAGNOSIS — Z1231 Encounter for screening mammogram for malignant neoplasm of breast: Secondary | ICD-10-CM

## 2019-12-21 NOTE — Progress Notes (Signed)
Ms. Lucylle Foulkes is a 49 y.o. female who presents to [facility] clinic today with no complaints.    Pap Smear: Pap smear not completed today. Last Pap smear was in 2019 at [facility] clinic and was normal per patient. Per patient has no history of an abnormal Pap smear. Last Pap smear result is not available in Epic.   Physical exam: Breasts Breasts symmetrical. No skin abnormalities bilateral breasts. No nipple retraction bilateral breasts. No nipple discharge bilateral breasts. No lymphadenopathy. No lumps palpated bilateral breasts. No complaints of pain or tenderness on exam.       Pelvic/Bimanual Pap is not indicated today per BCCCP guidelines.   Smoking History: Patient has never smoked.   Patient Navigation: Patient education provided. Access to services provided for patient through BCCCP program.   Breast and Cervical Cancer Risk Assessment: Patient does not have family history of breast cancer, known genetic mutations, or radiation treatment to the chest before age 3. Patient does not have history of cervical dysplasia, immunocompromised, or DES exposure in-utero.  Risk Assessment    Risk Scores      12/21/2019   Last edited by: Narda Rutherford, LPN   5-year risk: 0.4 %   Lifetime risk: 4.3 %          A: BCCCP exam without pap smear No complaints.  P: Referred patient to the [facility] for a screening mammogram on the mobile unit. Appointment scheduled Thursday, December 21, 2019 at 1000.  Priscille Heidelberg, RN 12/21/2019 9:20 AM

## 2019-12-21 NOTE — Patient Instructions (Signed)
Explained breast self awareness with Stacey Christian. Patient did not need a Pap smear today due to last Pap smear was in 2019 per patient. Let her know BCCCP will cover Pap smears every 3 years unless has a history of abnormal Pap smears. Referred patient to the [facility] for a screening mammogram on the mobile unit. Appointment scheduled Thursday, December 21, 2019 at 1000. Patient escorted to mobile unit following BCCCP appointment. Let patient know the Breast Center will follow up with her within the next couple weeks with results of her mammogram by letter or phone. Stacey Christian verbalized understanding.  [name], Kathaleen Maser, RN 9:21 AM

## 2020-10-27 ENCOUNTER — Emergency Department (HOSPITAL_COMMUNITY)
Admission: EM | Admit: 2020-10-27 | Discharge: 2020-10-27 | Disposition: A | Discharge status: Home / Self Care | Payer: No Typology Code available for payment source | Attending: Emergency Medicine | Admitting: Emergency Medicine

## 2020-10-27 ENCOUNTER — Encounter (HOSPITAL_COMMUNITY): Payer: Self-pay | Admitting: Emergency Medicine

## 2020-10-27 DIAGNOSIS — I160 Hypertensive urgency: Secondary | ICD-10-CM

## 2020-10-27 DIAGNOSIS — Z79899 Other long term (current) drug therapy: Secondary | ICD-10-CM | POA: Insufficient documentation

## 2020-10-27 DIAGNOSIS — Z7984 Long term (current) use of oral hypoglycemic drugs: Secondary | ICD-10-CM | POA: Insufficient documentation

## 2020-10-27 DIAGNOSIS — I10 Essential (primary) hypertension: Secondary | ICD-10-CM | POA: Insufficient documentation

## 2020-10-27 DIAGNOSIS — E119 Type 2 diabetes mellitus without complications: Secondary | ICD-10-CM | POA: Insufficient documentation

## 2020-10-27 LAB — TROPONIN I (HIGH SENSITIVITY)
Troponin I (High Sensitivity): 6 ng/L (ref <18)
Troponin I (High Sensitivity): 4 ng/L (ref <18)

## 2020-10-27 LAB — CBC
HCT: 41.8 % (ref 36.0–46.0)
Hemoglobin: 13.4 g/dL (ref 12.0–15.0)
MCH: 29.9 pg (ref 26.0–34.0)
MCHC: 32.1 g/dL (ref 30.0–36.0)
MCV: 93.3 fL (ref 80.0–100.0)
Platelets: 301 10*3/uL (ref 150–400)
RBC: 4.48 MIL/uL (ref 3.87–5.11)
RDW: 14.1 % (ref 11.5–15.5)
WBC: 10.5 10*3/uL (ref 4.0–10.5)
nRBC: 0 % (ref 0.0–0.2)

## 2020-10-27 LAB — BASIC METABOLIC PANEL
Anion gap: 8 (ref 5–15)
BUN: 12 mg/dL (ref 6–20)
CO2: 22 mmol/L (ref 22–32)
Calcium: 9 mg/dL (ref 8.9–10.3)
Chloride: 107 mmol/L (ref 98–111)
Creatinine, Ser: 0.63 mg/dL (ref 0.44–1.00)
GFR, Estimated: >60 mL/min (ref >60)
Glucose, Bld: 109 mg/dL — ABNORMAL HIGH (ref 70–99)
Potassium: 4.2 mmol/L (ref 3.5–5.1)
Sodium: 137 mmol/L (ref 135–145)

## 2020-10-27 LAB — I-STAT BETA HCG BLOOD, ED (MC, WL, AP ONLY): I-stat hCG, quantitative: <5 m[IU]/mL (ref <5)

## 2020-10-27 MED ORDER — LABETALOL HCL 200 MG PO TABS
100.0000 mg | ORAL_TABLET | Freq: Once | ORAL | Status: AC
Start: 2020-10-27 — End: 2020-10-27
  Administered 2020-10-27: 100 mg via ORAL
  Filled 2020-10-27: qty 1

## 2020-10-27 MED ORDER — METOPROLOL TARTRATE 25 MG PO TABS: 25.0000 mg | ORAL_TABLET | Freq: Two times a day (BID) | ORAL | 0 refills | Status: AC

## 2020-10-27 NOTE — ED Notes (Signed)
Pt discharged and ambulated out of the ED without difficulty. 

## 2020-10-27 NOTE — ED Provider Notes (Signed)
[facility] EMERGENCY DEPARTMENT Provider Note   CSN: [id number] Arrival date & time: 10/27/20  0042     History Chief Complaint  Patient presents with   Hypertension   Palpitations    Stacey Christian is a 50 y.o. female.  Patient is a 50 year old female with past medical history of diabetes, hypertension, hyperlipidemia.  She presents today for evaluation of elevated blood pressure.  Her blood pressure has been running high all week.  Her primary doctor made some changes in her blood pressure medicines, however this does not seem to be helping.  This evening, she woke up and felt as though her heart was pounding.  She checked her blood pressure and it was over 200 and presents for evaluation of this.  She denies chest pain or difficulty breathing.  She denies headache, weakness, or numbness, but has felt dizzy throughout the week.  The history is provided by the patient.      Past Medical History:  Diagnosis Date   Anemia    history    Diabetes mellitus    Heartburn in pregnancy    Hyperlipidemia    diet controlled   Hypertension    SVD (spontaneous vaginal delivery)    x 2    Patient Active Problem List   Diagnosis Date Noted   Family history of congenital heart defect 11/26/2011   ACUTE NASOPHARYNGITIS 03/26/2010   ALLERGIC RHINITIS 12/25/2009   OBESITY 12/04/2009   ACID REFLUX DISEASE 12/04/2009   COUGH 11/27/2009   EXTERNAL HEMORRHOIDS WITHOUT MENTION COMP 07/31/2009   HYPERCHOLESTEROLEMIA 10/10/2008   DIABETES MELLITUS 07/12/2008   HYPERTENSION, BENIGN ESSENTIAL 07/12/2008    Past Surgical History:  Procedure Laterality Date   CESAREAN SECTION     CESAREAN SECTION WITH BILATERAL TUBAL LIGATION  04/07/2012   Procedure: CESAREAN SECTION WITH BILATERAL TUBAL LIGATION;  Surgeon: Kathreen Cosier, MD;  Location: [facility];  Service: Obstetrics;  Laterality: Bilateral;   TUBAL LIGATION       OB History     Gravida  6   Para  4    Term  3   Preterm  1   AB  2   Living  4      SAB  2   IAB      Ectopic      Multiple      Live Births  3           Family History  Problem Relation Age of Onset   Diabetes Mother    Dementia Mother    Diabetes Father    Schizophrenia Brother    Anesthesia problems Neg Hx     Social History   Tobacco Use   Smoking status: Never   Smokeless tobacco: Never  Vaping Use   Vaping Use: Never used  Substance Use Topics   Alcohol use: No   Drug use: No    Home Medications Prior to Admission medications   Medication Sig Start Date End Date Taking? Authorizing Provider  hydrochlorothiazide (HYDRODIURIL) 25 MG tablet Take 25 mg by mouth daily.    [provider]  metFORMIN (GLUCOPHAGE) 1000 MG tablet Take 1,000 mg by mouth 2 (two) times daily with a meal.    [provider]  oxyCODONE-acetaminophen (PERCOCET/ROXICET) 5-325 MG per tablet Take 1-2 tablets by mouth every 6 (six) hours as needed (moderate - severe pain). 04/10/12   Antionette Char, MD  Prenatal Vit-Fe Fumarate-FA (PRENATAL MULTIVITAMIN) TABS Take 1 tablet by mouth every  morning. 04/10/12   Antionette Char, MD    Allergies    Lisinopril  Review of Systems   Review of Systems  All other systems reviewed and are negative.  Physical Exam Updated Vital Signs BP (!) 194/95 (BP Location: Left Arm)   Pulse (!) 104   Temp 98 F (36.7 C) (Oral)   Resp 20   SpO2 100%   Physical Exam Vitals and nursing note reviewed.  Constitutional:      General: She is not in acute distress.    Appearance: She is well-developed. She is not diaphoretic.  HENT:     Head: Normocephalic and atraumatic.  Eyes:     Extraocular Movements: Extraocular movements intact.     Pupils: Pupils are equal, round, and reactive to light.  Cardiovascular:     Rate and Rhythm: Normal rate and regular rhythm.     Heart sounds: No murmur heard.   No friction rub. No gallop.  Pulmonary:     Effort:  Pulmonary effort is normal. No respiratory distress.     Breath sounds: Normal breath sounds. No wheezing.  Abdominal:     General: Bowel sounds are normal. There is no distension.     Palpations: Abdomen is soft.     Tenderness: There is no abdominal tenderness.  Musculoskeletal:        General: Normal range of motion.     Cervical back: Normal range of motion and neck supple.  Skin:    General: Skin is warm and dry.  Neurological:     General: No focal deficit present.     Mental Status: She is alert and oriented to person, place, and time.     Cranial Nerves: No cranial nerve deficit.     Motor: No weakness.     Coordination: Coordination normal.    ED Results / Procedures / Treatments   Labs (all labs ordered are listed, but only abnormal results are displayed) Labs Reviewed  BASIC METABOLIC PANEL - Abnormal; Notable for the following components:      Result Value   Glucose, Bld 109 (*)    All other components within normal limits  CBC  I-STAT BETA HCG BLOOD, ED ([facility], [facility], [facility] ONLY)  TROPONIN I (HIGH SENSITIVITY)  TROPONIN I (HIGH SENSITIVITY)    EKG EKG Interpretation  Date/Time:  Sunday October 27 2020 01:59:18 EDT Ventricular Rate:  80 PR Interval:  154 QRS Duration: 74 QT Interval:  376 QTC Calculation: 433 R Axis:   11 Text Interpretation: Normal sinus rhythm Low voltage QRS Cannot rule out Anterior infarct , age undetermined Abnormal ECG Confirmed by Geoffery Lyons ([id number]) on 10/27/2020 3:46:11 AM  Radiology No results found.  Procedures Procedures   Medications Ordered in ED Medications  labetalol (NORMODYNE) tablet 100 mg (has no administration in time range)    ED Course  I have reviewed the triage vital signs and the nursing notes.  Pertinent labs & imaging results that were available during my care of the patient were reviewed by me and considered in my medical decision making (see chart for details).    MDM Rules/Calculators/A&P  Patient  presenting here with complaints of elevated blood pressure.  Her blood pressures at home have been nearly 200 systolic.  She reports feeling poorly.  Patient's blood pressure initially 194, however has improved to 130s after receiving labetalol.  Laboratory studies and remainder of work-up showed no evidence for endorgan damage.  Patient appears to be feeling better and seems appropriate  for discharge.  She will be discharged with metoprolol in addition to what she already takes.  She is to keep a record of her blood pressures and follow-up with primary doctor.  Final Clinical Impression(s) / ED Diagnoses Final diagnoses:  None    Rx / DC Orders ED Discharge Orders     None        Geoffery Lyons, MD 10/27/20 [phone]

## 2020-10-27 NOTE — Discharge Instructions (Addendum)
Begin taking metoprolol as prescribed.  Keep a record of your blood pressure over the next several days, then follow-up with your primary doctor for a recheck.  Return to the emergency department for severe headache, strokelike symptoms, chest pain, or other new and concerning symptoms.

## 2020-10-27 NOTE — ED Triage Notes (Signed)
Pt reports she has been struggling with high blood pressure, PCP changed her medications on Wednesday.  Tonight she was woken out of sleep by heart palpitations.  This is a new occurences.  No other symptoms at this time.

## 2021-01-21 ENCOUNTER — Other Ambulatory Visit: Payer: Self-pay

## 2021-01-21 ENCOUNTER — Ambulatory Visit (INDEPENDENT_AMBULATORY_CARE_PROVIDER_SITE_OTHER): Payer: Self-pay | Admitting: Cardiovascular Disease

## 2021-01-21 ENCOUNTER — Encounter: Payer: Self-pay | Admitting: Cardiovascular Disease

## 2021-01-21 ENCOUNTER — Telehealth: Payer: Self-pay

## 2021-01-21 VITALS — BP 148/80 | HR 68 | Ht 62.0 in | Wt 216.8 lb

## 2021-01-21 DIAGNOSIS — R072 Precordial pain: Secondary | ICD-10-CM

## 2021-01-21 DIAGNOSIS — I10 Essential (primary) hypertension: Secondary | ICD-10-CM

## 2021-01-21 DIAGNOSIS — R0789 Other chest pain: Secondary | ICD-10-CM

## 2021-01-21 DIAGNOSIS — E7800 Pure hypercholesterolemia, unspecified: Secondary | ICD-10-CM

## 2021-01-21 NOTE — Assessment & Plan Note (Signed)
History of hyperlipidemia on statin therapy.  We will recheck a lipid liver profile 

## 2021-01-21 NOTE — Assessment & Plan Note (Signed)
Ms. Stacey Christian was referred for atypical chest pain.  She does have a history of treated hypertension, diabetes and hyperlipidemia.  She was seen in the ER with dizziness and hypertension 10/27/2020.  She was placed on a beta-blocker by her PCP because of palpitations which have improved.  I am going to get a 2D echo and a coronary CTA to further evaluate.

## 2021-01-21 NOTE — Telephone Encounter (Signed)
Left message for pt to call back  °

## 2021-01-21 NOTE — Patient Instructions (Signed)
Medication Instructions:  Your physician recommends that you continue on your current medications as directed. Please refer to the Current Medication list given to you today.  *If you need a refill on your cardiac medications before your next appointment, please call your pharmacy*   Lab Work: Your physician recommends that you return for lab work in: within 7 days of coronary CTA- [facility]  Your physician recommends that you return for lab work in: next week or 2 for Fasting lipid/liver profile.   If you have labs (blood work) drawn today and your tests are completely normal, you will receive your results only by: MyChart Message (if you have MyChart) OR A paper copy in the mail If you have any lab test that is abnormal or we need to change your treatment, we will call you to review the results.   Testing/Procedures: Your physician has requested that you have an echocardiogram. Echocardiography is a painless test that uses sound waves to create images of your heart. It provides your doctor with information about the size and shape of your heart and how well your heart's chambers and valves are working. This procedure takes approximately one hour. There are no restrictions for this procedure. This procedure is done at [address].    Your cardiac CT will be scheduled at one of the below locations:   [facility] [address] [place], [place] [address] [phone]  If scheduled at [facility], please arrive at the [facility] main entrance (entrance A) of [facility] 30 minutes prior to test start time. Proceed to the [facility] Radiology Department (first floor) to check-in and test prep.   Please follow these instructions carefully (unless otherwise directed):   On the Night Before the Test: Be sure to Drink plenty of water. Do not consume any caffeinated/decaffeinated beverages or chocolate 12 hours prior to your test. Do not take any  antihistamines 12 hours prior to your test.  On the Day of the Test: Drink plenty of water until 1 hour prior to the test. Do not eat any food 4 hours prior to the test. You may take your regular medications prior to the test.  Take metoprolol (Lopressor) 50mg  two hours prior to test. HOLD Furosemide/Hydrochlorothiazide morning of the test. FEMALES- please wear underwire-free bra if available, avoid dresses & tight clothing  After the Test: Drink plenty of water. After receiving IV contrast, you may experience a mild flushed feeling. This is normal. On occasion, you may experience a mild rash up to 24 hours after the test. This is not dangerous. If this occurs, you can take Benadryl 25 mg and increase your fluid intake. If you experience trouble breathing, this can be serious. If it is severe call 911 IMMEDIATELY. If it is mild, please call our office. If you take any of these medications: Glipizide/Metformin, Avandament, Glucavance, please do not take 48 hours after completing test unless otherwise instructed.  Please allow 2-4 weeks for scheduling of routine cardiac CTs. Some insurance companies require a pre-authorization which may delay scheduling of this test.   For non-scheduling related questions, please contact the cardiac imaging nurse navigator should you have any questions/concerns: , Cardiac Imaging Nurse Navigator [place], Cardiac Imaging Nurse Navigator [facility] Direct Office Dial: [phone]   For scheduling needs, including cancellations and rescheduling, please call [phone], [phone].   Follow-Up: At [facility], you and your health needs are our priority.  As  part of our continuing mission to provide you with exceptional heart care, we have created designated Provider Care Teams.  These Care Teams include your primary Cardiologist (physician) and Advanced Practice Providers (APPs -  Physician Assistants and  Nurse Practitioners) who all work together to provide you with the care you need, when you need it.  We recommend signing up for the patient portal called "MyChart".  Sign up information is provided on this After Visit Summary.  MyChart is used to connect with patients for Virtual Visits (Telemedicine).  Patients are able to view lab/test results, encounter notes, upcoming appointments, etc.  Non-urgent messages can be sent to your provider as well.   To learn more about what you can do with MyChart, go to [email or web address].    Your next appointment:   2 month(s)  The format for your next appointment:   In Person  Provider:   Nanetta Batty, MD

## 2021-01-21 NOTE — Progress Notes (Signed)
01/21/2021 Stacey Christian   Apr 02, 1971  [id number]  Primary Physician Stacey Hammers, FNP Primary Cardiologist: Stacey Gess MD Stacey Christian, [place]  HPI:  Stacey Christian is a 50 y.o. moderately overweight separated Latino female mother of 4 children, with no grandchildren who works as a Conservation officer, nature and was referred by Stacey Lennert FNP for evaluation of atypical chest pain.  Risk factors include treated hypertension, diabetes and hyperlipidemia.  There is no family history for heart disease.  She is never had a heart attack or stroke.  She was seen in the emergency room on 10/27/2020 because of palpitations and elevated blood pressures.  She was begun on a beta-blocker after that by her primary care provider.  She gets occasional atypical chest burning.   Current Meds  Medication Sig   hydrochlorothiazide (HYDRODIURIL) 25 MG tablet Take 25 mg by mouth daily.   losartan (COZAAR) 25 MG tablet Take 25 mg by mouth daily.   metFORMIN (GLUCOPHAGE) 1000 MG tablet Take 1,000 mg by mouth 2 (two) times daily with a meal.   metoprolol tartrate (LOPRESSOR) 25 MG tablet Take 1 tablet (25 mg total) by mouth 2 (two) times daily.   simvastatin (ZOCOR) 20 MG tablet SMARTSIG:1 Tablet(s) By Mouth Every Evening   Vitamin D, Ergocalciferol, (DRISDOL) 1.25 MG (50000 UNIT) CAPS capsule Take 50,000 Units by mouth once a week.     Allergies  Allergen Reactions   Lisinopril Cough    Social History   Socioeconomic History   Marital status: Married    Spouse name: Not on file   Number of children: 4   Years of education: Not on file   Highest education level: 9th grade  Occupational History   Not on file  Tobacco Use   Smoking status: Never   Smokeless tobacco: Never  Vaping Use   Vaping Use: Never used  Substance and Sexual Activity   Alcohol use: No   Drug use: No   Sexual activity: Not Currently    Birth control/protection: Surgical  Other Topics Concern   Not on  file  Social History Narrative   Not on file   Social Determinants of Health   Financial Resource Strain: Not on file  Food Insecurity: Not on file  Transportation Needs: Not on file  Physical Activity: Not on file  Stress: Not on file  Social Connections: Not on file  Intimate Partner Violence: Not on file     Review of Systems: General: negative for chills, fever, night sweats or weight changes.  Cardiovascular: negative for chest pain, dyspnea on exertion, edema, orthopnea, palpitations, paroxysmal nocturnal dyspnea or shortness of breath Dermatological: negative for rash Respiratory: negative for cough or wheezing Urologic: negative for hematuria Abdominal: negative for nausea, vomiting, diarrhea, bright red blood per rectum, melena, or hematemesis Neurologic: negative for visual changes, syncope, or dizziness All other systems reviewed and are otherwise negative except as noted above.    Blood pressure (!) 148/80, pulse 68, height 5\' 2"  (1.575 m), weight 216 lb 12.8 oz (98.3 kg), SpO2 98 %.  General appearance: alert and no distress Neck: no adenopathy, no carotid bruit, no JVD, supple, symmetrical, trachea midline, and thyroid not enlarged, symmetric, no tenderness/mass/nodules Lungs: clear to auscultation bilaterally Heart: regular rate and rhythm, S1, S2 normal, no murmur, click, rub or gallop Extremities: extremities normal, atraumatic, no cyanosis or edema Pulses: 2+ and symmetric Skin: Skin color, texture, turgor normal. No rashes or lesions Neurologic: Grossly normal  EKG  sinus rhythm at 68 without ST or T wave changes.  I personally reviewed this EKG.  ASSESSMENT AND PLAN:   HYPERCHOLESTEROLEMIA History of hyperlipidemia on statin therapy.  We will recheck a lipid liver profile.  HYPERTENSION, BENIGN ESSENTIAL History of essential hypertension blood pressure measured today at 148/80.  She is on hydrochlorothiazide, losartan and metoprolol.  She does check her  blood pressure at home and her systolics run between 140-150 over the 80 range.  I have asked her to keep a blood pressure log every day for 30 days and she will see a Pharm.D. back to review.  Atypical chest pain Ms. Stacey Christian was referred for atypical chest pain.  She does have a history of treated hypertension, diabetes and hyperlipidemia.  She was seen in the ER with dizziness and hypertension 10/27/2020.  She was placed on a beta-blocker by her PCP because of palpitations which have improved.  I am going to get a 2D echo and a coronary CTA to further evaluate.     Stacey Gess MD FACP,FACC,FAHA, Stacey Christian 01/21/2021 11:12 AM

## 2021-01-21 NOTE — Assessment & Plan Note (Signed)
History of essential hypertension blood pressure measured today at 148/80.  She is on hydrochlorothiazide, losartan and metoprolol.  She does check her blood pressure at home and her systolics run between 140-150 over the 80 range.  I have asked her to keep a blood pressure log every day for 30 days and she will see a Pharm.D. back to review.

## 2021-02-06 LAB — HEPATIC FUNCTION PANEL
ALT: 33 IU/L — ABNORMAL HIGH (ref 0–32)
AST: 31 IU/L (ref 0–40)
Albumin: 4.3 g/dL (ref 3.8–4.8)
Alkaline Phosphatase: 77 IU/L (ref 44–121)
Bilirubin Total: 0.5 mg/dL (ref 0.0–1.2)
Bilirubin, Direct: 0.14 mg/dL (ref 0.00–0.40)
Total Protein: 7.5 g/dL (ref 6.0–8.5)

## 2021-02-06 LAB — BASIC METABOLIC PANEL
BUN/Creatinine Ratio: 21 (ref 9–23)
BUN: 14 mg/dL (ref 6–24)
CO2: 25 mmol/L (ref 20–29)
Calcium: 9.2 mg/dL (ref 8.7–10.2)
Chloride: 99 mmol/L (ref 96–106)
Creatinine, Ser: 0.67 mg/dL (ref 0.57–1.00)
Glucose: 212 mg/dL — ABNORMAL HIGH (ref 70–99)
Potassium: 4.5 mmol/L (ref 3.5–5.2)
Sodium: 137 mmol/L (ref 134–144)
eGFR: 107 mL/min/{1.73_m2} (ref >59)

## 2021-02-06 LAB — LIPID PANEL
Chol/HDL Ratio: 3 ratio (ref 0.0–4.4)
Cholesterol, Total: 149 mg/dL (ref 100–199)
HDL: 50 mg/dL (ref >39)
LDL Chol Calc (NIH): 85 mg/dL (ref 0–99)
Triglycerides: 69 mg/dL (ref 0–149)
VLDL Cholesterol Cal: 14 mg/dL (ref 5–40)

## 2021-02-13 ENCOUNTER — Ambulatory Visit (HOSPITAL_COMMUNITY): Payer: Self-pay | Attending: Cardiovascular Disease

## 2021-02-13 ENCOUNTER — Other Ambulatory Visit: Payer: Self-pay

## 2021-02-13 DIAGNOSIS — R072 Precordial pain: Secondary | ICD-10-CM | POA: Insufficient documentation

## 2021-02-13 DIAGNOSIS — R0789 Other chest pain: Secondary | ICD-10-CM | POA: Insufficient documentation

## 2021-02-13 DIAGNOSIS — I10 Essential (primary) hypertension: Secondary | ICD-10-CM | POA: Insufficient documentation

## 2021-02-13 DIAGNOSIS — E7800 Pure hypercholesterolemia, unspecified: Secondary | ICD-10-CM | POA: Insufficient documentation

## 2021-02-13 LAB — ECHOCARDIOGRAM COMPLETE
Area-P 1/2: 3.65 cm2
S' Lateral: 3 cm

## 2021-02-13 MED ORDER — PERFLUTREN LIPID MICROSPHERE
1.0000 mL | INTRAVENOUS | Status: AC | PRN
  Administered 2021-02-13: 2 mL via INTRAVENOUS

## 2021-03-18 ENCOUNTER — Other Ambulatory Visit: Payer: Self-pay

## 2021-03-18 ENCOUNTER — Encounter: Payer: Self-pay | Admitting: Cardiovascular Disease

## 2021-03-18 ENCOUNTER — Ambulatory Visit (INDEPENDENT_AMBULATORY_CARE_PROVIDER_SITE_OTHER): Payer: Self-pay | Admitting: Cardiovascular Disease

## 2021-03-18 VITALS — BP 130/72 | HR 68 | Ht 62.0 in | Wt 218.2 lb

## 2021-03-18 DIAGNOSIS — E7800 Pure hypercholesterolemia, unspecified: Secondary | ICD-10-CM

## 2021-03-18 DIAGNOSIS — R0789 Other chest pain: Secondary | ICD-10-CM

## 2021-03-18 NOTE — Patient Instructions (Signed)
Medication Instructions:  Your physician recommends that you continue on your current medications as directed. Please refer to the Current Medication list given to you today.  *If you need a refill on your cardiac medications before your next appointment, please call your pharmacy*   Testing/Procedures: Dr. Berry has ordered a CT coronary calcium score. This test is done at [address]. This is $99 out of pocket.   Coronary CalciumScan A coronary calcium scan is an imaging test used to look for deposits of calcium and other fatty materials (plaques) in the inner lining of the blood vessels of the heart (coronary arteries). These deposits of calcium and plaques can partly clog and narrow the coronary arteries without producing any symptoms or warning signs. This puts a person at risk for a heart attack. This test can detect these deposits before symptoms develop. Tell a health care provider about: Any allergies you have. All medicines you are taking, including vitamins, herbs, eye drops, creams, and over-the-counter medicines. Any problems you or family members have had with anesthetic medicines. Any blood disorders you have. Any surgeries you have had. Any medical conditions you have. Whether you are pregnant or may be pregnant. What are the risks? Generally, this is a safe procedure. However, problems may occur, including: Harm to a pregnant woman and her unborn baby. This test involves the use of radiation. Radiation exposure can be dangerous to a pregnant woman and her unborn baby. If you are pregnant, you generally should not have this procedure done. Slight increase in the risk of cancer. This is because of the radiation involved in the test. What happens before the procedure? No preparation is needed for this procedure. What happens during the procedure? You will undress and remove any jewelry around your neck or chest. You will put on a hospital gown. Sticky  electrodes will be placed on your chest. The electrodes will be connected to an electrocardiogram (ECG) machine to record a tracing of the electrical activity of your heart. A CT scanner will take pictures of your heart. During this time, you will be asked to lie still and hold your breath for 2-3 seconds while a picture of your heart is being taken. The procedure may vary among health care providers and hospitals. What happens after the procedure? You can get dressed. You can return to your normal activities. It is up to you to get the results of your test. Ask your health care provider, or the department that is doing the test, when your results will be ready. Summary A coronary calcium scan is an imaging test used to look for deposits of calcium and other fatty materials (plaques) in the inner lining of the blood vessels of the heart (coronary arteries). Generally, this is a safe procedure. Tell your health care provider if you are pregnant or may be pregnant. No preparation is needed for this procedure. A CT scanner will take pictures of your heart. You can return to your normal activities after the scan is done. This information is not intended to replace advice given to you by your health care provider. Make sure you discuss any questions you have with your health care provider. Document Released: 09/26/2007 Document Revised: 02/17/2016 Document Reviewed: 02/17/2016 Elsevier Interactive Patient Education  2017 Elsevier Inc.    Follow-Up: At [facility], you and your health needs are our priority.  As part of our continuing mission to provide you with exceptional heart care, we have created designated Provider   Care Teams.  These Care Teams include your primary Cardiologist (physician) and Advanced Practice Providers (APPs -  Physician Assistants and Nurse Practitioners) who all work together to provide you with the care you need, when you need it.  We recommend signing up for the  patient portal called "MyChart".  Sign up information is provided on this After Visit Summary.  MyChart is used to connect with patients for Virtual Visits (Telemedicine).  Patients are able to view lab/test results, encounter notes, upcoming appointments, etc.  Non-urgent messages can be sent to your provider as well.   To learn more about what you can do with MyChart, go to [email or web address].    Your next appointment:   We will see you on an as needed basis.  Provider:   Jonathan Berry, MD 

## 2021-03-18 NOTE — Progress Notes (Signed)
Ms. Stacey Christian returns today for follow-up of her outpatient test.  Her 2D echo performed 02/13/2021 was essentially normal.  I had ordered a coronary CTA which has yet to be done.  She is had no recurrent chest pain.  I am going to get a coronary calcium score to further evaluate I will see her back only if abnormal.  Stacey Christian, M.D., FACP, [facility], [facility] [address] [place], [place]  [address]  [phone] 03/18/2021 9:52 AM

## 2021-06-26 ENCOUNTER — Ambulatory Visit (INDEPENDENT_AMBULATORY_CARE_PROVIDER_SITE_OTHER)
Admission: RE | Admit: 2021-06-26 | Discharge: 2021-06-26 | Disposition: A | Discharge status: Home / Self Care | Payer: Self-pay | Source: Ambulatory Visit | Attending: Cardiovascular Disease | Admitting: Cardiovascular Disease

## 2021-06-26 ENCOUNTER — Other Ambulatory Visit: Payer: Self-pay

## 2021-06-26 DIAGNOSIS — E7800 Pure hypercholesterolemia, unspecified: Secondary | ICD-10-CM

## 2021-06-26 DIAGNOSIS — R0789 Other chest pain: Secondary | ICD-10-CM

## 2021-11-14 ENCOUNTER — Other Ambulatory Visit: Payer: Self-pay | Admitting: Family

## 2021-11-14 DIAGNOSIS — Z1231 Encounter for screening mammogram for malignant neoplasm of breast: Secondary | ICD-10-CM

## 2022-11-27 ENCOUNTER — Other Ambulatory Visit: Payer: Self-pay | Admitting: Family

## 2022-11-27 DIAGNOSIS — Z1231 Encounter for screening mammogram for malignant neoplasm of breast: Secondary | ICD-10-CM

## 2022-11-30 ENCOUNTER — Telehealth: Payer: Self-pay

## 2022-11-30 NOTE — Telephone Encounter (Signed)
Telephoned patient at mobile number using interpreter, Delorise Royals. Left patient voice message with [facility] (referral) contact information.

## 2023-01-28 ENCOUNTER — Ambulatory Visit: Payer: Self-pay

## 2023-02-09 NOTE — Telephone Encounter (Signed)
Telephoned patient at mobile number using interpreter, Stacey Christian. Left voice message with [facility] contact information.

## 2023-02-25 ENCOUNTER — Ambulatory Visit: Payer: Self-pay

## 2023-08-20 ENCOUNTER — Other Ambulatory Visit: Payer: Self-pay | Admitting: Family

## 2023-08-20 DIAGNOSIS — Z1231 Encounter for screening mammogram for malignant neoplasm of breast: Secondary | ICD-10-CM

## 2023-10-07 ENCOUNTER — Ambulatory Visit
Admission: RE | Admit: 2023-10-07 | Discharge: 2023-10-07 | Disposition: A | Discharge status: Home / Self Care | Payer: Self-pay | Source: Ambulatory Visit | Attending: Family | Admitting: Family

## 2023-10-07 DIAGNOSIS — Z1231 Encounter for screening mammogram for malignant neoplasm of breast: Secondary | ICD-10-CM

## 2023-10-21 NOTE — Progress Notes (Signed)
 SUBJECTIVE: Patient presents for vaccine services.  OBJECTIVE: Vaccinated to prevent communicable disease. Vaccine history reviewed.  ASSESSMENT: Vaccine screening completed.  PLAN: Contraindications reviewed and VIS form/s given today. Consent reviewed and signed by patient. Vaccination record updated. Patient instructed on side effects related to vaccine/s and to seek emergency service should any adverse reactions occur.   Administered:  Immunizations Administered on Date of Encounter - 10/20/2023  Never Reviewed    Name Date   Hep B, Adult/Adol (ENERGIX-B-ADULT/RECOMBIVAX-ADULT) 10/20/2023       Patient tolerated vaccination well.  Patient verbalized understanding of vaccine care and instructions.

## 2023-11-11 ENCOUNTER — Ambulatory Visit: Payer: Self-pay | Admitting: Physical Medicine and Rehabilitation

## 2024-01-20 ENCOUNTER — Encounter (HOSPITAL_BASED_OUTPATIENT_CLINIC_OR_DEPARTMENT_OTHER): Payer: Self-pay

## 2024-01-20 ENCOUNTER — Emergency Department (HOSPITAL_BASED_OUTPATIENT_CLINIC_OR_DEPARTMENT_OTHER): Payer: MEDICAID

## 2024-01-20 ENCOUNTER — Other Ambulatory Visit: Payer: Self-pay

## 2024-01-20 ENCOUNTER — Inpatient Hospital Stay (HOSPITAL_BASED_OUTPATIENT_CLINIC_OR_DEPARTMENT_OTHER)
Admission: EM | Admit: 2024-01-20 | Discharge: 2024-01-22 | DRG: 854 | Disposition: A | Discharge status: Home / Self Care | Payer: MEDICAID | Attending: Family Medicine | Admitting: Family Medicine

## 2024-01-20 DIAGNOSIS — K82A1 Gangrene of gallbladder in cholecystitis: Secondary | ICD-10-CM | POA: Diagnosis present

## 2024-01-20 DIAGNOSIS — E669 Obesity, unspecified: Secondary | ICD-10-CM | POA: Diagnosis present

## 2024-01-20 DIAGNOSIS — E876 Hypokalemia: Secondary | ICD-10-CM | POA: Diagnosis present

## 2024-01-20 DIAGNOSIS — Z6837 Body mass index (BMI) 37.0-37.9, adult: Secondary | ICD-10-CM

## 2024-01-20 DIAGNOSIS — Z79899 Other long term (current) drug therapy: Secondary | ICD-10-CM

## 2024-01-20 DIAGNOSIS — K810 Acute cholecystitis: Principal | ICD-10-CM | POA: Diagnosis present

## 2024-01-20 DIAGNOSIS — E1100 Type 2 diabetes mellitus with hyperosmolarity without nonketotic hyperglycemic-hyperosmolar coma (NKHHC): Secondary | ICD-10-CM | POA: Diagnosis present

## 2024-01-20 DIAGNOSIS — Z1152 Encounter for screening for COVID-19: Secondary | ICD-10-CM

## 2024-01-20 DIAGNOSIS — Z7984 Long term (current) use of oral hypoglycemic drugs: Secondary | ICD-10-CM

## 2024-01-20 DIAGNOSIS — E7800 Pure hypercholesterolemia, unspecified: Secondary | ICD-10-CM | POA: Diagnosis present

## 2024-01-20 DIAGNOSIS — E66812 Obesity, class 2: Secondary | ICD-10-CM | POA: Diagnosis present

## 2024-01-20 DIAGNOSIS — K219 Gastro-esophageal reflux disease without esophagitis: Secondary | ICD-10-CM | POA: Diagnosis present

## 2024-01-20 DIAGNOSIS — K8000 Calculus of gallbladder with acute cholecystitis without obstruction: Secondary | ICD-10-CM | POA: Diagnosis present

## 2024-01-20 DIAGNOSIS — E119 Type 2 diabetes mellitus without complications: Secondary | ICD-10-CM | POA: Diagnosis present

## 2024-01-20 DIAGNOSIS — A419 Sepsis, unspecified organism: Principal | ICD-10-CM | POA: Diagnosis present

## 2024-01-20 DIAGNOSIS — Z818 Family history of other mental and behavioral disorders: Secondary | ICD-10-CM

## 2024-01-20 DIAGNOSIS — Z833 Family history of diabetes mellitus: Secondary | ICD-10-CM

## 2024-01-20 DIAGNOSIS — I10 Essential (primary) hypertension: Secondary | ICD-10-CM | POA: Diagnosis present

## 2024-01-20 DIAGNOSIS — Z888 Allergy status to other drugs, medicaments and biological substances status: Secondary | ICD-10-CM

## 2024-01-20 LAB — CBC WITH DIFFERENTIAL/PLATELET
Abs Immature Granulocytes: 0.03 K/uL (ref 0.00–0.07)
Basophils Absolute: 0 K/uL (ref 0.0–0.1)
Basophils Relative: 0 %
Eosinophils Absolute: 0 K/uL (ref 0.0–0.5)
Eosinophils Relative: 0 %
HCT: 39.2 % (ref 36.0–46.0)
Hemoglobin: 13.2 g/dL (ref 12.0–15.0)
Immature Granulocytes: 0 %
Lymphocytes Relative: 31 %
Lymphs Abs: 3.9 K/uL (ref 0.7–4.0)
MCH: 30 pg (ref 26.0–34.0)
MCHC: 33.7 g/dL (ref 30.0–36.0)
MCV: 89.1 fL (ref 80.0–100.0)
Monocytes Absolute: 0.6 K/uL (ref 0.1–1.0)
Monocytes Relative: 5 %
Neutro Abs: 8 K/uL — ABNORMAL HIGH (ref 1.7–7.7)
Neutrophils Relative %: 64 %
Platelets: 286 K/uL (ref 150–400)
RBC: 4.4 MIL/uL (ref 3.87–5.11)
RDW: 13.5 % (ref 11.5–15.5)
WBC: 12.6 K/uL — ABNORMAL HIGH (ref 4.0–10.5)
nRBC: 0 % (ref 0.0–0.2)

## 2024-01-20 LAB — COMPREHENSIVE METABOLIC PANEL WITH GFR
ALT: 23 U/L (ref 0–44)
AST: 16 U/L (ref 15–41)
Albumin: 4.1 g/dL (ref 3.5–5.0)
Alkaline Phosphatase: 72 U/L (ref 38–126)
Anion gap: 15 (ref 5–15)
BUN: 12 mg/dL (ref 6–20)
CO2: 26 mmol/L (ref 22–32)
Calcium: 9.3 mg/dL (ref 8.9–10.3)
Chloride: 96 mmol/L — ABNORMAL LOW (ref 98–111)
Creatinine, Ser: 0.65 mg/dL (ref 0.44–1.00)
GFR, Estimated: >60 mL/min (ref >60)
Glucose, Bld: 164 mg/dL — ABNORMAL HIGH (ref 70–99)
Potassium: 3.2 mmol/L — ABNORMAL LOW (ref 3.5–5.1)
Sodium: 137 mmol/L (ref 135–145)
Total Bilirubin: 1 mg/dL (ref 0.0–1.2)
Total Protein: 8.1 g/dL (ref 6.5–8.1)

## 2024-01-20 LAB — CBC
HCT: 38.4 % (ref 36.0–46.0)
Hemoglobin: 13 g/dL (ref 12.0–15.0)
MCH: 30.2 pg (ref 26.0–34.0)
MCHC: 33.9 g/dL (ref 30.0–36.0)
MCV: 89.1 fL (ref 80.0–100.0)
Platelets: 272 K/uL (ref 150–400)
RBC: 4.31 MIL/uL (ref 3.87–5.11)
RDW: 13.4 % (ref 11.5–15.5)
WBC: 13.5 K/uL — ABNORMAL HIGH (ref 4.0–10.5)
nRBC: 0 % (ref 0.0–0.2)

## 2024-01-20 LAB — RESP PANEL BY RT-PCR (RSV, FLU A&B, COVID)  RVPGX2
Influenza A by PCR: NEGATIVE
Influenza B by PCR: NEGATIVE
Resp Syncytial Virus by PCR: NEGATIVE
SARS Coronavirus 2 by RT PCR: NEGATIVE

## 2024-01-20 LAB — URINALYSIS, ROUTINE W REFLEX MICROSCOPIC
Bilirubin Urine: NEGATIVE
Glucose, UA: NEGATIVE mg/dL
Hgb urine dipstick: NEGATIVE
Ketones, ur: NEGATIVE mg/dL
Nitrite: NEGATIVE
Protein, ur: NEGATIVE mg/dL
Specific Gravity, Urine: 1.008 (ref 1.005–1.030)
pH: 6.5 (ref 5.0–8.0)

## 2024-01-20 LAB — HEMOGLOBIN A1C
Hgb A1c MFr Bld: 7.5 % — ABNORMAL HIGH (ref 4.8–5.6)
Mean Plasma Glucose: 168.55 mg/dL

## 2024-01-20 LAB — LIPASE, BLOOD: Lipase: 28 U/L (ref 11–51)

## 2024-01-20 LAB — LACTIC ACID, PLASMA
Lactic Acid, Venous: 0.9 mmol/L (ref 0.5–1.9)
Lactic Acid, Venous: 1.8 mmol/L (ref 0.5–1.9)

## 2024-01-20 LAB — GLUCOSE, CAPILLARY: Glucose-Capillary: 85 mg/dL (ref 70–99)

## 2024-01-20 LAB — CREATININE, SERUM
Creatinine, Ser: 0.69 mg/dL (ref 0.44–1.00)
GFR, Estimated: >60 mL/min (ref >60)

## 2024-01-20 MED ORDER — ACETAMINOPHEN 500 MG PO TABS
1000.0000 mg | ORAL_TABLET | Freq: Four times a day (QID) | ORAL | Status: DC
  Administered 2024-01-20 – 2024-01-22 (×5): 1000 mg via ORAL
  Filled 2024-01-20 (×5): qty 2

## 2024-01-20 MED ORDER — ONDANSETRON HCL 4 MG PO TABS: 4.0000 mg | ORAL_TABLET | Freq: Four times a day (QID) | ORAL | Status: DC | PRN

## 2024-01-20 MED ORDER — SODIUM CHLORIDE 0.9 % IV SOLN: INTRAVENOUS | Status: DC

## 2024-01-20 MED ORDER — SODIUM CHLORIDE 0.9 % IV BOLUS
1000.0000 mL | Freq: Once | INTRAVENOUS | Status: AC
  Administered 2024-01-20: 1000 mL via INTRAVENOUS

## 2024-01-20 MED ORDER — INSULIN ASPART 100 UNIT/ML IJ SOLN
0.0000 [IU] | Freq: Three times a day (TID) | INTRAMUSCULAR | Status: DC
  Administered 2024-01-21 – 2024-01-22 (×4): 1 [IU] via SUBCUTANEOUS

## 2024-01-20 MED ORDER — POTASSIUM CHLORIDE 20 MEQ PO PACK
40.0000 meq | PACK | Freq: Once | ORAL | Status: DC
  Filled 2024-01-20: qty 2

## 2024-01-20 MED ORDER — ONDANSETRON HCL 4 MG/2ML IJ SOLN: 4.0000 mg | Freq: Four times a day (QID) | INTRAMUSCULAR | Status: DC | PRN

## 2024-01-20 MED ORDER — PIPERACILLIN-TAZOBACTAM 3.375 G IVPB
3.3750 g | Freq: Three times a day (TID) | INTRAVENOUS | Status: DC
  Administered 2024-01-20 – 2024-01-22 (×5): 3.375 g via INTRAVENOUS
  Filled 2024-01-20 (×4): qty 50

## 2024-01-20 MED ORDER — ENOXAPARIN SODIUM 40 MG/0.4ML IJ SOSY
40.0000 mg | PREFILLED_SYRINGE | INTRAMUSCULAR | Status: DC
  Administered 2024-01-20 – 2024-01-21 (×2): 40 mg via SUBCUTANEOUS
  Filled 2024-01-20 (×2): qty 0.4

## 2024-01-20 MED ORDER — HYDRALAZINE HCL 20 MG/ML IJ SOLN: 10.0000 mg | Freq: Four times a day (QID) | INTRAMUSCULAR | Status: DC | PRN

## 2024-01-20 MED ORDER — ACETAMINOPHEN 650 MG RE SUPP: 650.0000 mg | Freq: Four times a day (QID) | RECTAL | Status: DC | PRN

## 2024-01-20 MED ORDER — SODIUM CHLORIDE 0.9 % IV SOLN
2.0000 g | Freq: Once | INTRAVENOUS | Status: AC
  Administered 2024-01-20: 2 g via INTRAVENOUS
  Filled 2024-01-20: qty 20

## 2024-01-20 MED ORDER — LACTATED RINGERS IV SOLN: INTRAVENOUS | Status: AC

## 2024-01-20 MED ORDER — FENTANYL CITRATE (PF) 50 MCG/ML IJ SOSY
50.0000 ug | PREFILLED_SYRINGE | Freq: Once | INTRAMUSCULAR | Status: AC
  Administered 2024-01-20: 50 ug via INTRAVENOUS
  Filled 2024-01-20: qty 1

## 2024-01-20 MED ORDER — ACETAMINOPHEN 325 MG PO TABS: 650.0000 mg | ORAL_TABLET | Freq: Four times a day (QID) | ORAL | Status: DC | PRN

## 2024-01-20 MED ORDER — DOCUSATE SODIUM 100 MG PO CAPS
100.0000 mg | ORAL_CAPSULE | Freq: Two times a day (BID) | ORAL | Status: DC
  Administered 2024-01-20 – 2024-01-22 (×3): 100 mg via ORAL
  Filled 2024-01-20 (×3): qty 1

## 2024-01-20 MED ORDER — MORPHINE SULFATE (PF) 2 MG/ML IV SOLN: 2.0000 mg | INTRAVENOUS | Status: DC | PRN

## 2024-01-20 NOTE — Consult Note (Addendum)
 Consult Note  Stacey Christian March 21, 1971  [id number].    Requesting MD: Juliene Bicker, DO Chief Complaint/Reason for Consult: Possible acute cholecystitis   HPI:  Patient is a 53 year old female with PMH significant for HTN, HLD, T2DM, obesity who presented to the ED with abdominal pain. Pain started 5 days ago and was severe across her upper abdomen. Associated nausea and emesis but gradually improved with pepto bismol, tums and motrin . Pain started to come back 3 days ago and gradually worsened with more severe pain recurring last night. Denies fever, chills, chest pain, SOB, urinary symptoms. Prior abdominal surgery includes cesarean section x2. Denies significant alcohol, drug or tobacco use. Initially evaluated at PCP's office and noted to be febrile to 101 and was sent to ED for evaluation.   She denies any prior onset of symptoms prior to 5 days ago.  Pain has been started constant in the past 2-1/2 days but waxes and wanes but generally present.  She has been a little bit afraid to eat.  Last bowel movement was this morning.  ROS: Negative other than HPI  Family History  Problem Relation Age of Onset   Diabetes Mother    Dementia Mother    Diabetes Father    Schizophrenia Brother    Anesthesia problems Neg Hx     Past Medical History:  Diagnosis Date   Anemia    history    Diabetes mellitus    Heartburn in pregnancy    Hyperlipidemia    diet controlled   Hypertension    SVD (spontaneous vaginal delivery)    x 2    Past Surgical History:  Procedure Laterality Date   CESAREAN SECTION     CESAREAN SECTION WITH BILATERAL TUBAL LIGATION  04/07/2012   Procedure: CESAREAN SECTION WITH BILATERAL TUBAL LIGATION;  Surgeon: Aida DELENA Na, MD;  Location: [facility];  Service: Obstetrics;  Laterality: Bilateral;   TUBAL LIGATION      Social History:  reports that she has never smoked. She has never used smokeless tobacco. She reports that she does not drink  alcohol and does not use drugs.  Allergies:  Allergies  Allergen Reactions   Lisinopril Cough    (Not in a hospital admission)   Blood pressure 137/67, pulse 93, temperature 99.1 F (37.3 C), temperature source Oral, resp. rate 16, height 5' 3 (1.6 m), weight 97.1 kg, last menstrual period 12/18/2019, SpO2 100%. Physical Exam:  General: pleasant, WD, obese female who is laying in bed in NAD HEENT: head is normocephalic, atraumatic.  Sclera are anicteric.  Ears and nose without any masses or lesions.  Mouth is pink and moist Heart: regular, rate, and rhythm.  Normal s1,s2. No obvious murmurs, gallops, or rubs noted.  Palpable radial and pedal pulses bilaterally Lungs: CTAB, no wheezes, rhonchi, or rales noted.  Respiratory effort nonlabored Abd: soft, mild tenderness to palpation right upper quadrant no rebound or guarding, ND, +BS, no masses, hernias, or organomegaly MS: all 4 extremities are symmetrical with no cyanosis, clubbing, or edema. Skin: warm and dry with no masses, lesions, or rashes Neuro: Cranial nerves 2-12 grossly intact, sensation is normal throughout Psych: A&Ox3 with an appropriate affect.   Results for orders placed or performed during the hospital encounter of 01/20/24 (from the past 48 hours)  CBC with Differential     Status: Abnormal   Collection Time: 01/20/24 12:31 PM  Result Value Ref Range   WBC 12.6 (H) 4.0 - 10.5  K/uL   RBC 4.40 3.87 - 5.11 MIL/uL   Hemoglobin 13.2 12.0 - 15.0 g/dL   HCT 60.7 63.9 - 53.9 %   MCV 89.1 80.0 - 100.0 fL   MCH 30.0 26.0 - 34.0 pg   MCHC 33.7 30.0 - 36.0 g/dL   RDW 86.4 88.4 - 84.4 %   Platelets 286 150 - 400 K/uL   nRBC 0.0 0.0 - 0.2 %   Neutrophils Relative % 64 %   Neutro Abs 8.0 (H) 1.7 - 7.7 K/uL   Lymphocytes Relative 31 %   Lymphs Abs 3.9 0.7 - 4.0 K/uL   Monocytes Relative 5 %   Monocytes Absolute 0.6 0.1 - 1.0 K/uL   Eosinophils Relative 0 %   Eosinophils Absolute 0.0 0.0 - 0.5 K/uL   Basophils Relative  0 %   Basophils Absolute 0.0 0.0 - 0.1 K/uL   Immature Granulocytes 0 %   Abs Immature Granulocytes 0.03 0.00 - 0.07 K/uL    Comment: Performed at [facility], [address], [place], [place] [address]  Comprehensive metabolic panel     Status: Abnormal   Collection Time: 01/20/24 12:31 PM  Result Value Ref Range   Sodium 137 135 - 145 mmol/L   Potassium 3.2 (L) 3.5 - 5.1 mmol/L   Chloride 96 (L) 98 - 111 mmol/L   CO2 26 22 - 32 mmol/L   Glucose, Bld 164 (H) 70 - 99 mg/dL    Comment: Glucose reference range applies only to samples taken after fasting for at least 8 hours.   BUN 12 6 - 20 mg/dL   Creatinine, Ser 9.34 0.44 - 1.00 mg/dL   Calcium 9.3 8.9 - 89.6 mg/dL   Total Protein 8.1 6.5 - 8.1 g/dL   Albumin 4.1 3.5 - 5.0 g/dL   AST 16 15 - 41 U/L   ALT 23 0 - 44 U/L   Alkaline Phosphatase 72 38 - 126 U/L   Total Bilirubin 1.0 0.0 - 1.2 mg/dL   GFR, Estimated >39 >39 mL/min    Comment: (NOTE) Calculated using the CKD-EPI Creatinine Equation (2021)    Anion gap 15 5 - 15    Comment: Performed at [facility], [address], [place], [place] [address]  Lipase, blood     Status: None   Collection Time: 01/20/24 12:31 PM  Result Value Ref Range   Lipase 28 11 - 51 U/L    Comment: Performed at [facility], [address], [place], [place] [address]  Urinalysis, Routine w reflex microscopic -Urine, Clean Catch     Status: Abnormal   Collection Time: 01/20/24 12:31 PM  Result Value Ref Range   Color, Urine YELLOW YELLOW   APPearance HAZY (A) CLEAR   Specific Gravity, Urine 1.008 1.005 - 1.030   pH 6.5 5.0 - 8.0   Glucose, UA NEGATIVE NEGATIVE mg/dL   Hgb urine dipstick NEGATIVE NEGATIVE   Bilirubin Urine NEGATIVE NEGATIVE   Ketones, ur NEGATIVE NEGATIVE mg/dL   Protein, ur NEGATIVE NEGATIVE mg/dL   Nitrite NEGATIVE NEGATIVE   Leukocytes,Ua LARGE (A) NEGATIVE   RBC / HPF 6-10 0 - 5 RBC/hpf   WBC, UA 11-20 0 - 5  WBC/hpf   Bacteria, UA RARE (A) NONE SEEN   Squamous Epithelial / HPF 11-20 0 - 5 /HPF   Mucus PRESENT    Budding Yeast PRESENT    Crystals PRESENT (A) NEGATIVE    Comment: Performed at [facility], [id number]  [address], [place], [place] [address]  Resp panel by RT-PCR (RSV, Flu A&B, Covid) Anterior Nasal Swab     Status: None   Collection Time: 01/20/24 12:57 PM   Specimen: Anterior Nasal Swab  Result Value Ref Range   SARS Coronavirus 2 by RT PCR NEGATIVE NEGATIVE    Comment: (NOTE) SARS-CoV-2 target nucleic acids are NOT DETECTED.  The SARS-CoV-2 RNA is generally detectable in upper respiratory specimens during the acute phase of infection. The lowest concentration of SARS-CoV-2 viral copies this assay can detect is 138 copies/mL. A negative result does not preclude SARS-Cov-2 infection and should not be used as the sole basis for treatment or other patient management decisions. A negative result may occur with  improper specimen collection/handling, submission of specimen other than nasopharyngeal swab, presence of viral mutation(s) within the areas targeted by this assay, and inadequate number of viral copies(<138 copies/mL). A negative result must be combined with clinical observations, patient history, and epidemiological information. The expected result is Negative.  Fact Sheet for Patients:  BloggerCourse.com  Fact Sheet for Healthcare Providers:  SeriousBroker.it  This test is no t yet approved or cleared by the [place]  FDA and  has been authorized for detection and/or diagnosis of SARS-CoV-2 by FDA under an Emergency Use Authorization (EUA). This EUA will remain  in effect (meaning this test can be used) for the duration of the COVID-19 declaration under Section 564(b)(1) of the Act, 21 U.S.C.section 360bbb-3(b)(1), unless the authorization is terminated  or revoked sooner.       Influenza A  by PCR NEGATIVE NEGATIVE   Influenza B by PCR NEGATIVE NEGATIVE    Comment: (NOTE) The Xpert Xpress SARS-CoV-2/FLU/RSV plus assay is intended as an aid in the diagnosis of influenza from Nasopharyngeal swab specimens and should not be used as a sole basis for treatment. Nasal washings and aspirates are unacceptable for Xpert Xpress SARS-CoV-2/FLU/RSV testing.  Fact Sheet for Patients: BloggerCourse.com  Fact Sheet for Healthcare Providers: SeriousBroker.it  This test is not yet approved or cleared by the [place]  FDA and has been authorized for detection and/or diagnosis of SARS-CoV-2 by FDA under an Emergency Use Authorization (EUA). This EUA will remain in effect (meaning this test can be used) for the duration of the COVID-19 declaration under Section 564(b)(1) of the Act, 21 U.S.C. section 360bbb-3(b)(1), unless the authorization is terminated or revoked.     Resp Syncytial Virus by PCR NEGATIVE NEGATIVE    Comment: (NOTE) Fact Sheet for Patients: BloggerCourse.com  Fact Sheet for Healthcare Providers: SeriousBroker.it  This test is not yet approved or cleared by the [place]  FDA and has been authorized for detection and/or diagnosis of SARS-CoV-2 by FDA under an Emergency Use Authorization (EUA). This EUA will remain in effect (meaning this test can be used) for the duration of the COVID-19 declaration under Section 564(b)(1) of the Act, 21 U.S.C. section 360bbb-3(b)(1), unless the authorization is terminated or revoked.  Performed at [facility], [address] [place], [place] [address]    US  Abdomen Limited RUQ (LIVER/GB) Result Date: 01/20/2024 CLINICAL DATA:  Abdominal pain and nausea for 1 week. EXAM: ULTRASOUND ABDOMEN LIMITED RIGHT UPPER QUADRANT COMPARISON:  None Available. FINDINGS: Gallbladder: Cholelithiasis is noted with  moderate gallbladder wall thickening measured at 5 mm. No sonographic Murphy's sign is noted. Sludge is noted as well. Common bile duct: Diameter: 5 mm which is within normal limits. Liver: No focal lesion identified. Within normal limits in parenchymal echogenicity. Portal vein is  patent on color Doppler imaging with normal direction of blood flow towards the liver. Other: None. IMPRESSION: Cholelithiasis is noted with moderate gallbladder wall thickening. No sonographic Murphy's sign is noted. If there is clinical concern for cholecystitis, HIDA scan is recommended for further evaluation. Electronically Signed   By: Lynwood Landy Raddle M.D.   On: 01/20/2024 14:00   DG Chest Portable 1 View Result Date: 01/20/2024 EXAM: 1 VIEW XRAY OF THE CHEST 01/20/2024 12:48:00 PM COMPARISON: None available. CLINICAL HISTORY: fever. Abdominal pain and nausea x 1 week; hx of anemia, hypertension, diabetes FINDINGS: LUNGS AND PLEURA: No focal pulmonary opacity. No pulmonary edema. No pleural effusion. No pneumothorax. HEART AND MEDIASTINUM: No acute abnormality of the cardiac and mediastinal silhouettes. BONES AND SOFT TISSUES: No acute osseous abnormality. IMPRESSION: 1. No acute process. Electronically signed by: Norleen Boxer MD 01/20/2024 01:42 PM EDT RP Workstation: [id number]      Assessment/Plan Cholelithiasis with probable acute cholecystitis  - RUQ US  today with cholelithiasis and moderate gallbladder wall thickening, no sonographic murphy sign - WNC 12.6K, LFTs normal - afebrile, hypertensive, mildly tachycardic - I think story is consistent with acute calculous cholecystitis - Recommend IV antibiotics and laparoscopic cholecystectomy with ICG dye tomorrow with Dr. Ebbie  FEN: Can have a diet this evening, n.p.o. 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  We discussed gallbladder disease.  We discussed non-operative and operative management. We discussed the signs & symptoms of acute cholecystitis  I discussed laparoscopic cholecystectomy with possible IOC in detail.  The patient was shown diagrams detailing the procedure.  We discussed the risks and benefits of a laparoscopic cholecystectomy including, but not limited to bleeding, infection, injury to surrounding structures such as the intestine or liver, bile leak, retained gallstones, need to convert to an open procedure, prolonged diarrhea, blood clots such as  DVT, common bile duct injury, anesthesia risks, and possible need for additional procedures.  We discussed the typical post-operative recovery course. I explained that the likelihood of improvement of their symptoms is good.  We did discuss the possibility of fenestrated cholecystectomy and potential increased risk of postoperative bile leak should that procedure need to be performed due to significant inflammation.   I reviewed ED provider notes, last 24 h vitals and pain scores, last 48 h intake and output, last 24 h labs and trends, and last 24 h imaging results.  This care required high  level of medical decision making.   Stacey HERO. Tanda, MD, FACS General, Bariatric, & Minimally Invasive Surgery [facility],  [facility] 01/20/2024, 3:19 PM Please see Amion for pager number during day hours 7:00am-4:30pm

## 2024-01-20 NOTE — H&P (View-Only) (Signed)
 Consult Note  Stacey Christian March 21, 1971  [id number].    Requesting MD: Juliene Bicker, DO Chief Complaint/Reason for Consult: Possible acute cholecystitis   HPI:  Patient is a 53 year old female with PMH significant for HTN, HLD, T2DM, obesity who presented to the ED with abdominal pain. Pain started 5 days ago and was severe across her upper abdomen. Associated nausea and emesis but gradually improved with pepto bismol, tums and motrin . Pain started to come back 3 days ago and gradually worsened with more severe pain recurring last night. Denies fever, chills, chest pain, SOB, urinary symptoms. Prior abdominal surgery includes cesarean section x2. Denies significant alcohol, drug or tobacco use. Initially evaluated at PCP's office and noted to be febrile to 101 and was sent to ED for evaluation.   She denies any prior onset of symptoms prior to 5 days ago.  Pain has been started constant in the past 2-1/2 days but waxes and wanes but generally present.  She has been a little bit afraid to eat.  Last bowel movement was this morning.  ROS: Negative other than HPI  Family History  Problem Relation Age of Onset   Diabetes Mother    Dementia Mother    Diabetes Father    Schizophrenia Brother    Anesthesia problems Neg Hx     Past Medical History:  Diagnosis Date   Anemia    history    Diabetes mellitus    Heartburn in pregnancy    Hyperlipidemia    diet controlled   Hypertension    SVD (spontaneous vaginal delivery)    x 2    Past Surgical History:  Procedure Laterality Date   CESAREAN SECTION     CESAREAN SECTION WITH BILATERAL TUBAL LIGATION  04/07/2012   Procedure: CESAREAN SECTION WITH BILATERAL TUBAL LIGATION;  Surgeon: Aida DELENA Na, MD;  Location: [facility];  Service: Obstetrics;  Laterality: Bilateral;   TUBAL LIGATION      Social History:  reports that she has never smoked. She has never used smokeless tobacco. She reports that she does not drink  alcohol and does not use drugs.  Allergies:  Allergies  Allergen Reactions   Lisinopril Cough    (Not in a hospital admission)   Blood pressure 137/67, pulse 93, temperature 99.1 F (37.3 C), temperature source Oral, resp. rate 16, height 5' 3 (1.6 m), weight 97.1 kg, last menstrual period 12/18/2019, SpO2 100%. Physical Exam:  General: pleasant, WD, obese female who is laying in bed in NAD HEENT: head is normocephalic, atraumatic.  Sclera are anicteric.  Ears and nose without any masses or lesions.  Mouth is pink and moist Heart: regular, rate, and rhythm.  Normal s1,s2. No obvious murmurs, gallops, or rubs noted.  Palpable radial and pedal pulses bilaterally Lungs: CTAB, no wheezes, rhonchi, or rales noted.  Respiratory effort nonlabored Abd: soft, mild tenderness to palpation right upper quadrant no rebound or guarding, ND, +BS, no masses, hernias, or organomegaly MS: all 4 extremities are symmetrical with no cyanosis, clubbing, or edema. Skin: warm and dry with no masses, lesions, or rashes Neuro: Cranial nerves 2-12 grossly intact, sensation is normal throughout Psych: A&Ox3 with an appropriate affect.   Results for orders placed or performed during the hospital encounter of 01/20/24 (from the past 48 hours)  CBC with Differential     Status: Abnormal   Collection Time: 01/20/24 12:31 PM  Result Value Ref Range   WBC 12.6 (H) 4.0 - 10.5  K/uL   RBC 4.40 3.87 - 5.11 MIL/uL   Hemoglobin 13.2 12.0 - 15.0 g/dL   HCT 60.7 63.9 - 53.9 %   MCV 89.1 80.0 - 100.0 fL   MCH 30.0 26.0 - 34.0 pg   MCHC 33.7 30.0 - 36.0 g/dL   RDW 86.4 88.4 - 84.4 %   Platelets 286 150 - 400 K/uL   nRBC 0.0 0.0 - 0.2 %   Neutrophils Relative % 64 %   Neutro Abs 8.0 (H) 1.7 - 7.7 K/uL   Lymphocytes Relative 31 %   Lymphs Abs 3.9 0.7 - 4.0 K/uL   Monocytes Relative 5 %   Monocytes Absolute 0.6 0.1 - 1.0 K/uL   Eosinophils Relative 0 %   Eosinophils Absolute 0.0 0.0 - 0.5 K/uL   Basophils Relative  0 %   Basophils Absolute 0.0 0.0 - 0.1 K/uL   Immature Granulocytes 0 %   Abs Immature Granulocytes 0.03 0.00 - 0.07 K/uL    Comment: Performed at [facility], [address], [place], [place] [address]  Comprehensive metabolic panel     Status: Abnormal   Collection Time: 01/20/24 12:31 PM  Result Value Ref Range   Sodium 137 135 - 145 mmol/L   Potassium 3.2 (L) 3.5 - 5.1 mmol/L   Chloride 96 (L) 98 - 111 mmol/L   CO2 26 22 - 32 mmol/L   Glucose, Bld 164 (H) 70 - 99 mg/dL    Comment: Glucose reference range applies only to samples taken after fasting for at least 8 hours.   BUN 12 6 - 20 mg/dL   Creatinine, Ser 9.34 0.44 - 1.00 mg/dL   Calcium 9.3 8.9 - 89.6 mg/dL   Total Protein 8.1 6.5 - 8.1 g/dL   Albumin 4.1 3.5 - 5.0 g/dL   AST 16 15 - 41 U/L   ALT 23 0 - 44 U/L   Alkaline Phosphatase 72 38 - 126 U/L   Total Bilirubin 1.0 0.0 - 1.2 mg/dL   GFR, Estimated >39 >39 mL/min    Comment: (NOTE) Calculated using the CKD-EPI Creatinine Equation (2021)    Anion gap 15 5 - 15    Comment: Performed at [facility], [address], [place], [place] [address]  Lipase, blood     Status: None   Collection Time: 01/20/24 12:31 PM  Result Value Ref Range   Lipase 28 11 - 51 U/L    Comment: Performed at [facility], [address], [place], [place] [address]  Urinalysis, Routine w reflex microscopic -Urine, Clean Catch     Status: Abnormal   Collection Time: 01/20/24 12:31 PM  Result Value Ref Range   Color, Urine YELLOW YELLOW   APPearance HAZY (A) CLEAR   Specific Gravity, Urine 1.008 1.005 - 1.030   pH 6.5 5.0 - 8.0   Glucose, UA NEGATIVE NEGATIVE mg/dL   Hgb urine dipstick NEGATIVE NEGATIVE   Bilirubin Urine NEGATIVE NEGATIVE   Ketones, ur NEGATIVE NEGATIVE mg/dL   Protein, ur NEGATIVE NEGATIVE mg/dL   Nitrite NEGATIVE NEGATIVE   Leukocytes,Ua LARGE (A) NEGATIVE   RBC / HPF 6-10 0 - 5 RBC/hpf   WBC, UA 11-20 0 - 5  WBC/hpf   Bacteria, UA RARE (A) NONE SEEN   Squamous Epithelial / HPF 11-20 0 - 5 /HPF   Mucus PRESENT    Budding Yeast PRESENT    Crystals PRESENT (A) NEGATIVE    Comment: Performed at [facility], [id number]  [address], [place], [place] [address]  Resp panel by RT-PCR (RSV, Flu A&B, Covid) Anterior Nasal Swab     Status: None   Collection Time: 01/20/24 12:57 PM   Specimen: Anterior Nasal Swab  Result Value Ref Range   SARS Coronavirus 2 by RT PCR NEGATIVE NEGATIVE    Comment: (NOTE) SARS-CoV-2 target nucleic acids are NOT DETECTED.  The SARS-CoV-2 RNA is generally detectable in upper respiratory specimens during the acute phase of infection. The lowest concentration of SARS-CoV-2 viral copies this assay can detect is 138 copies/mL. A negative result does not preclude SARS-Cov-2 infection and should not be used as the sole basis for treatment or other patient management decisions. A negative result may occur with  improper specimen collection/handling, submission of specimen other than nasopharyngeal swab, presence of viral mutation(s) within the areas targeted by this assay, and inadequate number of viral copies(<138 copies/mL). A negative result must be combined with clinical observations, patient history, and epidemiological information. The expected result is Negative.  Fact Sheet for Patients:  BloggerCourse.com  Fact Sheet for Healthcare Providers:  SeriousBroker.it  This test is no t yet approved or cleared by the [place]  FDA and  has been authorized for detection and/or diagnosis of SARS-CoV-2 by FDA under an Emergency Use Authorization (EUA). This EUA will remain  in effect (meaning this test can be used) for the duration of the COVID-19 declaration under Section 564(b)(1) of the Act, 21 U.S.C.section 360bbb-3(b)(1), unless the authorization is terminated  or revoked sooner.       Influenza A  by PCR NEGATIVE NEGATIVE   Influenza B by PCR NEGATIVE NEGATIVE    Comment: (NOTE) The Xpert Xpress SARS-CoV-2/FLU/RSV plus assay is intended as an aid in the diagnosis of influenza from Nasopharyngeal swab specimens and should not be used as a sole basis for treatment. Nasal washings and aspirates are unacceptable for Xpert Xpress SARS-CoV-2/FLU/RSV testing.  Fact Sheet for Patients: BloggerCourse.com  Fact Sheet for Healthcare Providers: SeriousBroker.it  This test is not yet approved or cleared by the [place]  FDA and has been authorized for detection and/or diagnosis of SARS-CoV-2 by FDA under an Emergency Use Authorization (EUA). This EUA will remain in effect (meaning this test can be used) for the duration of the COVID-19 declaration under Section 564(b)(1) of the Act, 21 U.S.C. section 360bbb-3(b)(1), unless the authorization is terminated or revoked.     Resp Syncytial Virus by PCR NEGATIVE NEGATIVE    Comment: (NOTE) Fact Sheet for Patients: BloggerCourse.com  Fact Sheet for Healthcare Providers: SeriousBroker.it  This test is not yet approved or cleared by the [place]  FDA and has been authorized for detection and/or diagnosis of SARS-CoV-2 by FDA under an Emergency Use Authorization (EUA). This EUA will remain in effect (meaning this test can be used) for the duration of the COVID-19 declaration under Section 564(b)(1) of the Act, 21 U.S.C. section 360bbb-3(b)(1), unless the authorization is terminated or revoked.  Performed at [facility], [address] [place], [place] [address]    US  Abdomen Limited RUQ (LIVER/GB) Result Date: 01/20/2024 CLINICAL DATA:  Abdominal pain and nausea for 1 week. EXAM: ULTRASOUND ABDOMEN LIMITED RIGHT UPPER QUADRANT COMPARISON:  None Available. FINDINGS: Gallbladder: Cholelithiasis is noted with  moderate gallbladder wall thickening measured at 5 mm. No sonographic Murphy's sign is noted. Sludge is noted as well. Common bile duct: Diameter: 5 mm which is within normal limits. Liver: No focal lesion identified. Within normal limits in parenchymal echogenicity. Portal vein is  patent on color Doppler imaging with normal direction of blood flow towards the liver. Other: None. IMPRESSION: Cholelithiasis is noted with moderate gallbladder wall thickening. No sonographic Murphy's sign is noted. If there is clinical concern for cholecystitis, HIDA scan is recommended for further evaluation. Electronically Signed   By: Lynwood Landy Raddle M.D.   On: 01/20/2024 14:00   DG Chest Portable 1 View Result Date: 01/20/2024 EXAM: 1 VIEW XRAY OF THE CHEST 01/20/2024 12:48:00 PM COMPARISON: None available. CLINICAL HISTORY: fever. Abdominal pain and nausea x 1 week; hx of anemia, hypertension, diabetes FINDINGS: LUNGS AND PLEURA: No focal pulmonary opacity. No pulmonary edema. No pleural effusion. No pneumothorax. HEART AND MEDIASTINUM: No acute abnormality of the cardiac and mediastinal silhouettes. BONES AND SOFT TISSUES: No acute osseous abnormality. IMPRESSION: 1. No acute process. Electronically signed by: Norleen Boxer MD 01/20/2024 01:42 PM EDT RP Workstation: [id number]      Assessment/Plan Cholelithiasis with probable acute cholecystitis  - RUQ US  today with cholelithiasis and moderate gallbladder wall thickening, no sonographic murphy sign - WNC 12.6K, LFTs normal - afebrile, hypertensive, mildly tachycardic - I think story is consistent with acute calculous cholecystitis - Recommend IV antibiotics and laparoscopic cholecystectomy with ICG dye tomorrow with Dr. Ebbie  FEN: Can have a diet this evening, n.p.o. 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  We discussed gallbladder disease.  We discussed non-operative and operative management. We discussed the signs & symptoms of acute cholecystitis  I discussed laparoscopic cholecystectomy with possible IOC in detail.  The patient was shown diagrams detailing the procedure.  We discussed the risks and benefits of a laparoscopic cholecystectomy including, but not limited to bleeding, infection, injury to surrounding structures such as the intestine or liver, bile leak, retained gallstones, need to convert to an open procedure, prolonged diarrhea, blood clots such as  DVT, common bile duct injury, anesthesia risks, and possible need for additional procedures.  We discussed the typical post-operative recovery course. I explained that the likelihood of improvement of their symptoms is good.  We did discuss the possibility of fenestrated cholecystectomy and potential increased risk of postoperative bile leak should that procedure need to be performed due to significant inflammation.   I reviewed ED provider notes, last 24 h vitals and pain scores, last 48 h intake and output, last 24 h labs and trends, and last 24 h imaging results.  This care required high  level of medical decision making.   Stacey HERO. Tanda, MD, FACS General, Bariatric, & Minimally Invasive Surgery [facility],  [facility] 01/20/2024, 3:19 PM Please see Amion for pager number during day hours 7:00am-4:30pm

## 2024-01-20 NOTE — ED Notes (Signed)
 [facility] called for transport, spoke with [facility].

## 2024-01-20 NOTE — ED Triage Notes (Signed)
 Arrives ambulatory to the ED with complaints of abdominal pain and nausea x1 week.  Patient states that she started having a fever today. Sent here by her doctor for further evaluation (rule out gallbladder inflammation).

## 2024-01-20 NOTE — ED Notes (Signed)
 Unable to obtain blood work patient hard stick

## 2024-01-20 NOTE — ED Notes (Signed)
 Patient notified of need for urine sample to complete eval/assessment. Specimen cup provided and instructions for clean catch given. Patient will notify staff when able to provide

## 2024-01-20 NOTE — ED Provider Notes (Signed)
 [place] [facility] DEPARTMENT AT [facility] Provider Note   CSN: [id number] Arrival date & time: 01/20/24  1146     Patient presents with: Abdominal Pain and Nausea   Stacey Christian is a 53 y.o. female.   Stacey Christian is a 53 yo female presenting with abdominal pain.  5 days ago, the patient experienced 10/10 upper quadrant pain (indicates RUQ, epigastrium, and LUQ). Patient reports nausea with this first pain episode and she made herself vomit with mild relief. She took Pepto Bismol, ibuprofen , and Tums with gradual improvement.  She went to work the next day and pain slowly resolved. 3 days ago, the pain gradually returned again.  Last night, the patient had another episode of the sharper 10/10 pain. Patient has not noticed any fevers or chills at home.  Patient endorses history of 2 Cesarean sections, most recent 11 years ago. Denies diarrhea, hematochezia, melena. She has never had any pain like this before. Denies drinking alcohol, does not smoke tobacco, does not use any other drugs.  Today, was evaluated at PCP's office and found to be tachycardic with fever of 101 F and elevated BP.  Was sent to ED for evaluation.   Abdominal Pain Associated symptoms: no chest pain, no chills, no cough, no dysuria, no fever, no hematuria, no shortness of breath, no sore throat and no vomiting       Prior to Admission medications   Medication Sig Start Date End Date Taking? Authorizing Provider  hydrochlorothiazide (HYDRODIURIL) 25 MG tablet Take 25 mg by mouth daily.    [provider]  losartan (COZAAR) 25 MG tablet Take 25 mg by mouth daily. 12/10/20   [provider]  metFORMIN (GLUCOPHAGE) 1000 MG tablet Take 1,000 mg by mouth 2 (two) times daily with a meal.    [provider]  metoprolol  tartrate (LOPRESSOR ) 25 MG tablet Take 1 tablet (25 mg total) by mouth 2 (two) times daily. 10/27/20   Geroldine Berg, MD  pantoprazole  (PROTONIX) 40 MG tablet Take 40 mg by mouth daily. 02/26/21   [provider]  simvastatin (ZOCOR) 20 MG tablet SMARTSIG:1 Tablet(s) By Mouth Every Evening 12/12/20   [provider]  Vitamin D, Ergocalciferol, (DRISDOL) 1.25 MG (50000 UNIT) CAPS capsule Take 50,000 Units by mouth once a week. 12/12/20   [provider]    Allergies: Lisinopril    Review of Systems  Constitutional:  Negative for chills and fever.  HENT:  Negative for ear pain and sore throat.   Eyes:  Negative for pain and visual disturbance.  Respiratory:  Negative for cough and shortness of breath.   Cardiovascular:  Negative for chest pain and palpitations.  Gastrointestinal:  Positive for abdominal pain. Negative for vomiting.  Genitourinary:  Negative for dysuria and hematuria.  Musculoskeletal:  Negative for arthralgias and back pain.  Skin:  Negative for color change and rash.  Neurological:  Negative for seizures and syncope.  All other systems reviewed and are negative.   Updated Vital Signs BP (!) 171/84 (BP Location: Right Arm)   Pulse (!) 106   Temp 99.2 F (37.3 C) (Oral)   Resp 20   Ht 5' 3 (1.6 m)   Wt 97.1 kg   LMP 12/18/2019 (Exact Date)   SpO2 100%   BMI 37.91 kg/m   Physical Exam Vitals and nursing note reviewed.  Constitutional:      General: She is not in acute distress.    Appearance: She is well-developed.  HENT:  Head: Normocephalic and atraumatic.     Mouth/Throat:     Mouth: Mucous membranes are moist.  Eyes:     Conjunctiva/sclera: Conjunctivae normal.  Cardiovascular:     Rate and Rhythm: Normal rate and regular rhythm.     Heart sounds: No murmur heard. Pulmonary:     Effort: Pulmonary effort is normal. No respiratory distress.     Breath sounds: Normal breath sounds.  Abdominal:     General: Abdomen is flat. Bowel sounds are normal.     Palpations: Abdomen is soft.     Tenderness: There is abdominal tenderness in the right upper quadrant,  epigastric area and left upper quadrant. There is no guarding or rebound. Positive signs include Murphy's sign.     Hernia: No hernia is present.  Musculoskeletal:        General: No swelling.     Cervical back: Neck supple.  Skin:    General: Skin is warm and dry.     Capillary Refill: Capillary refill takes less than 2 seconds.  Neurological:     Mental Status: She is alert.  Psychiatric:        Mood and Affect: Mood normal.    (all labs ordered are listed, but only abnormal results are displayed) Labs Reviewed  CBC WITH DIFFERENTIAL/PLATELET  COMPREHENSIVE METABOLIC PANEL WITH GFR  LIPASE, BLOOD  URINALYSIS, ROUTINE W REFLEX MICROSCOPIC    EKG: None  Radiology: No results found.   Procedures   Medications Ordered in the ED - No data to display                                 Medical Decision Making The patient is a 54 year old female presenting with intermittent upper abdominal pain and recent fever at PCPs office.  Given the patient's age, sex, BMI, and colicky symptoms cholelithiasis is a serious consideration.  Patient's mild leukocytosis and recent fever increase concern for cholecystitis versus other abdominal infection.  However, CMP reassuring with normal transaminases and alkaline phosphatase.  Also considered nephrolithiasis; UA consistent with dirty catch but notably had some crystals and microscopic hematuria.  CXR negative for pneumonia or other acute pulmonary process.  Hemoglobin stable, no concern for GI bleed.  Lipase within normal limits.  EKG with NSR and no acute ischemic changes.  Right upper quadrant ultrasound showed cholelithiasis with moderate gallbladder thickening concerning for cholecystitis.  1 L bolus and subsequent maintenance LR ordered.  Ceftriaxone started, blood cultures and lactate drawn.  At time of departure from the ED, Dr. Ruthe has call to general surgery placed for further discussion of surgical intervention versus admission for  medical management.  Amount and/or Complexity of Data Reviewed Labs: ordered. Decision-making details documented in ED Course. Radiology: ordered and independent interpretation performed. Decision-making details documented in ED Course. ECG/medicine tests: ordered and independent interpretation performed. Decision-making details documented in ED Course.  Risk Prescription drug management. Decision regarding hospitalization.      Final diagnoses:  None    ED Discharge Orders     None        Caley Ciaramitaro, MD 01/20/24 1414    Ruthe Cornet, DO 01/20/24 1511

## 2024-01-20 NOTE — Progress Notes (Signed)
 Plan of Care Note for accepted transfer   Patient: Stacey Christian MRN: [id number]   DOA: 01/20/2024  Facility requesting transfer: [facility] Requesting Provider: Ruthe Cornet MD Reason for transfer: Abdominal pain concerning for acute cholecystitis Facility course:   Stacey Christian is a 53 year old female with past medical history significant for HTN, HLD, DM2, GERD, obesity who presented to [facility] with abdominal pain, fever and nausea.  Workup with right upper quadrant ultrasound concerning for acute cholecystitis.  ED physician discussed with general surgery on-call, Dr. Ebbie who requested medical admission to [facility] for need of surgical management.  Patient received NS 1 L bolus, fentanyl , IV ceftriaxone and started on continuous LR infusion.   Plan of care: The patient is accepted for admission to Med-surg  unit, at [facility]..    Please page general surgery on-call at [facility] once patient arrives to unit  Author: Camellia PARAS [place], DO 01/20/2024  Check [email or web address] for on-call coverage.  Nursing staff, Please call [facility] Admits & Consults System-Wide number on Amion as soon as patient's arrival, so appropriate admitting provider can evaluate the pt.

## 2024-01-20 NOTE — ED Notes (Signed)
 Patient transported to Ultrasound

## 2024-01-20 NOTE — ED Provider Notes (Signed)
.  Critical Care  Performed by: Ruthe Cornet, DO Authorized by: Ruthe Cornet, DO   Critical care provider statement:    Critical care time (minutes):  35   Critical care was necessary to treat or prevent imminent or life-threatening deterioration of the following conditions:  Sepsis   Critical care was time spent personally by me on the following activities:  Blood draw for specimens, development of treatment plan with patient or surrogate, discussions with consultants, discussions with primary provider, evaluation of patient's response to treatment, examination of patient, obtaining history from patient or surrogate, ordering and performing treatments and interventions, ordering and review of laboratory studies, ordering and review of radiographic studies, pulse oximetry, re-evaluation of patient's condition and review of old charts  See separate note for history physical examination results disposition.   Ruthe Cornet, DO 01/20/24 1511

## 2024-01-20 NOTE — H&P (Addendum)
 History and Physical    Stacey Christian FMW:[id number] DOB: 1970-09-20 DOA: 01/20/2024  PCP: [name] LABOR, FNP   Patient coming from: Home  I have personally briefly reviewed patient's old medical records in [facility]  Chief Complaint: Mid abdominal pain for 5 days.  HPI: Stacey Christian is a 53 y.o. female with medical history significant of essential hypertension, hyperlipidemia, diabetes mellitus on metformin, morbid obesity presented to the ED with complaints of epigastric abdominal pain that started 5 days ago,  she could not slept at night and in the morning pain eased  She felt better afterwards.  She has developed same pain today morning,  described pain as generalized associated with nausea and fever, denies any vomiting.  She went to her PCP today and was told to come to the ED.  Pain gets worse after eating greasy food.   ED Course: She was hemodynamically stable except tachycardia, tachypnea and hypertension. Temp 99.2, HR 104, RR 21, BP 171/80, Spo2 985 RA. Labs include: Sodium 137, potassium 3.3, chloride 96, bicarb 23, glucose 164, BUN 12, creatinine 0.65, calcium 9.3, anion gap 15, alkaline phosphatase 72, albumin 4.1, lipase 28, AST 16, ALT 23, total protein 8.1, total bilirubin 1.0, WBC 10.6, hemoglobin 13.2, hematocrit 39.2, platelet 286, lactic acid 0.9, influenza, COVID, RSV negative, UA leukocytes large, chest x-ray no acute abnormality. Ultrasound abdomen: Cholelithiasis is noted with moderate gallbladder wall thickening. No sonographic Murphy's sign is noted. If there is clinical concern for cholecystitis, HIDA scan is recommended for further evaluation. EKG shows sinus tachycardia, no ST-T wave changes  Review of Systems:  Review of Systems  Constitutional:  Positive for chills, fever and malaise/fatigue.  HENT: Negative.    Eyes: Negative.   Respiratory: Negative.    Cardiovascular: Negative.   Gastrointestinal:  Positive for abdominal pain  and nausea.  Genitourinary: Negative.   Musculoskeletal: Negative.   Skin: Negative.   Neurological: Negative.   Endo/Heme/Allergies: Negative.   Psychiatric/Behavioral: Negative.       Past Medical History:  Diagnosis Date   Anemia    history    Diabetes mellitus    Heartburn in pregnancy    Hyperlipidemia    diet controlled   Hypertension    SVD (spontaneous vaginal delivery)    x 2    Past Surgical History:  Procedure Laterality Date   CESAREAN SECTION     CESAREAN SECTION WITH BILATERAL TUBAL LIGATION  04/07/2012   Procedure: CESAREAN SECTION WITH BILATERAL TUBAL LIGATION;  Surgeon: Aida LABOR Na, MD;  Location: [facility];  Service: Obstetrics;  Laterality: Bilateral;   TUBAL LIGATION       reports that she has never smoked. She has never used smokeless tobacco. She reports that she does not drink alcohol and does not use drugs.  Allergies  Allergen Reactions   Lisinopril Cough    Family History  Problem Relation Age of Onset   Diabetes Mother    Dementia Mother    Diabetes Father    Schizophrenia Brother    Anesthesia problems Neg Hx     Family history reviewed and not pertinent.  Prior to Admission medications   Medication Sig Start Date End Date Taking? Authorizing Provider  hydrochlorothiazide (HYDRODIURIL) 25 MG tablet Take 25 mg by mouth daily.   Yes [provider]  losartan (COZAAR) 25 MG tablet Take 25 mg by mouth daily. 12/10/20  Yes [provider]  metFORMIN (GLUCOPHAGE) 1000 MG tablet Take 1,000 mg by mouth 2 (two)  times daily with a meal.   Yes [provider]  metoprolol  tartrate (LOPRESSOR ) 25 MG tablet Take 1 tablet (25 mg total) by mouth 2 (two) times daily. 10/27/20  Yes Delo, Vicenta, MD  pantoprazole (PROTONIX) 40 MG tablet Take 40 mg by mouth daily. 02/26/21  Yes [provider]  simvastatin (ZOCOR) 20 MG tablet SMARTSIG:1 Tablet(s) By Mouth Every Evening 12/12/20  Yes [provider]     Physical Exam: Vitals:   01/20/24 1509 01/20/24 1510 01/20/24 1530 01/20/24 1600  BP:  (!) 159/74 (!) 147/66 (!) 134/57  Pulse:  (!) 107 (!) 103 (!) 101  Resp:  16 16 19   Temp: 99.1 F (37.3 C)     TempSrc: Oral     SpO2:  98% 99% 100%  Weight:      Height:        Constitutional: Appears comfortable, not in any acute distress. Vitals:   01/20/24 1509 01/20/24 1510 01/20/24 1530 01/20/24 1600  BP:  (!) 159/74 (!) 147/66 (!) 134/57  Pulse:  (!) 107 (!) 103 (!) 101  Resp:  16 16 19   Temp: 99.1 F (37.3 C)     TempSrc: Oral     SpO2:  98% 99% 100%  Weight:      Height:       Eyes: PERRL, lids and conjunctivae normal ENMT: Mucous membranes are moist. Posterior pharynx clear of any exudate or lesions. Neck: normal, supple, no masses, no thyromegaly Respiratory: Clear to auscultation bilaterally, no wheezing, no crackles. Normal respiratory effort. No accessory muscle use.  Cardiovascular: S1-S2 heard, regular rate and rhythm, no murmurs / rubs / gallops. No extremity edema. 2+ pedal pulses. No carotid bruits.  Abdomen: Abdominal soft, RUQ, epigastric tenderness noted, bowel sounds present Musculoskeletal: no clubbing / cyanosis. No joint deformity upper and lower extremities. Good ROM, no contractures. Normal muscle tone.  Skin: no rashes, lesions, ulcers. No induration Neurologic: CN 2-12 grossly intact. Sensation intact, DTR normal. Strength 5/5 in all 4.  Psychiatric: Normal judgment and insight. Alert and oriented x 3. Normal mood.  EXTR  Labs on Admission: I have personally reviewed following labs and imaging studies  CBC: Recent Labs  Lab 01/20/24 1231  WBC 12.6*  NEUTROABS 8.0*  HGB 13.2  HCT 39.2  MCV 89.1  PLT 286   Basic Metabolic Panel: Recent Labs  Lab 01/20/24 1231  NA 137  K 3.2*  CL 96*  CO2 26  GLUCOSE 164*  BUN 12  CREATININE 0.65  CALCIUM 9.3   GFR: Estimated Creatinine Clearance: 91.3 mL/min (by C-G formula based on SCr of 0.65  mg/dL). Liver Function Tests: Recent Labs  Lab 01/20/24 1231  AST 16  ALT 23  ALKPHOS 72  BILITOT 1.0  PROT 8.1  ALBUMIN 4.1   Recent Labs  Lab 01/20/24 1231  LIPASE 28   No results for input(s): AMMONIA in the last 168 hours. Coagulation Profile: No results for input(s): INR, PROTIME in the last 168 hours. Cardiac Enzymes: No results for input(s): CKTOTAL, CKMB, CKMBINDEX, TROPONINI in the last 168 hours. BNP (last 3 results) No results for input(s): PROBNP in the last 8760 hours. HbA1C: No results for input(s): HGBA1C in the last 72 hours. CBG: No results for input(s): GLUCAP in the last 168 hours. Lipid Profile: No results for input(s): CHOL, HDL, LDLCALC, TRIG, CHOLHDL, LDLDIRECT in the last 72 hours. Thyroid Function Tests: No results for input(s): TSH, T4TOTAL, FREET4, T3FREE, THYROIDAB in the last 72 hours. Anemia  Panel: No results for input(s): VITAMINB12, FOLATE, FERRITIN, TIBC, IRON, RETICCTPCT in the last 72 hours. Urine analysis:    Component Value Date/Time   COLORURINE YELLOW 01/20/2024 1231   APPEARANCEUR HAZY (A) 01/20/2024 1231   LABSPEC 1.008 01/20/2024 1231   PHURINE 6.5 01/20/2024 1231   GLUCOSEU NEGATIVE 01/20/2024 1231   HGBUR NEGATIVE 01/20/2024 1231   HGBUR trace-lysed 07/31/2009 0956   BILIRUBINUR NEGATIVE 01/20/2024 1231   KETONESUR NEGATIVE 01/20/2024 1231   PROTEINUR NEGATIVE 01/20/2024 1231   UROBILINOGEN 0.2 09/13/2011 2015   NITRITE NEGATIVE 01/20/2024 1231   LEUKOCYTESUR LARGE (A) 01/20/2024 1231    Radiological Exams on Admission: US  Abdomen Limited RUQ (LIVER/GB) Result Date: 01/20/2024 CLINICAL DATA:  Abdominal pain and nausea for 1 week. EXAM: ULTRASOUND ABDOMEN LIMITED RIGHT UPPER QUADRANT COMPARISON:  None Available. FINDINGS: Gallbladder: Cholelithiasis is noted with moderate gallbladder wall thickening measured at 5 mm. No sonographic Murphy's sign is noted. Sludge is  noted as well. Common bile duct: Diameter: 5 mm which is within normal limits. Liver: No focal lesion identified. Within normal limits in parenchymal echogenicity. Portal vein is patent on color Doppler imaging with normal direction of blood flow towards the liver. Other: None. IMPRESSION: Cholelithiasis is noted with moderate gallbladder wall thickening. No sonographic Murphy's sign is noted. If there is clinical concern for cholecystitis, HIDA scan is recommended for further evaluation. Electronically Signed   By: Lynwood Landy Raddle M.D.   On: 01/20/2024 14:00   DG Chest Portable 1 View Result Date: 01/20/2024 EXAM: 1 VIEW XRAY OF THE CHEST 01/20/2024 12:48:00 PM COMPARISON: None available. CLINICAL HISTORY: fever. Abdominal pain and nausea x 1 week; hx of anemia, hypertension, diabetes FINDINGS: LUNGS AND PLEURA: No focal pulmonary opacity. No pulmonary edema. No pleural effusion. No pneumothorax. HEART AND MEDIASTINUM: No acute abnormality of the cardiac and mediastinal silhouettes. BONES AND SOFT TISSUES: No acute osseous abnormality. IMPRESSION: 1. No acute process. Electronically signed by: Norleen Boxer MD 01/20/2024 01:42 PM EDT RP Workstation: [id number]    EKG: Independently reviewed.  Sinus tachycardia, no ectopy  Assessment/Plan Principal Problem:   Acute cholecystitis Active Problems:   HYPERCHOLESTEROLEMIA   OBESITY   HYPERTENSION, BENIGN ESSENTIAL   DM (diabetes mellitus) (HCC)  Epigastric abdominal pain: Patient presented with intermittent abdominal pain radiating from right quadrant to left quadrant associated with fever and nausea , denies any vomiting. Patient is tachycardic, tachypneic , leucocytosis Met SIRS criteria. Lactic acid 0.9. Ultrasound shows cholelithiasis is noted with moderate gallbladder wall thickening. Continue NPO, may require surgical intervention in AM. Initiated on IV Zosyn for intra-abdominal infection. Continue IV fluid,  Continue IV Zofran  as needed for  nausea and vomiting. Continue IV morphine  as needed for pain control.   General surgery is consulted.  Diabetes Mellitus II: He reports taking metformin.  Keep it on hold,  start regular insulin sliding scale  Hyperlipidemia; Will resume statin  Hypertension: Hydralazine as needed Hold hydrochlorothiazide.  Obesity: Diet and exercise discussed in detail.  Hypokalemia: Repleted.  Continue to monitor  DVT prophylaxis: Lovenox Code Status: Full code Family Communication: No family at bed side. Disposition Plan:    Status is: Inpatient Remains inpatient appropriate because: Admitted for Acute cholecystitis   Consults called: General Surgery Admission status: Inpatient   Darcel Dawley MD [facility]   01/20/2024, 5:53 PM

## 2024-01-20 NOTE — Sepsis Progress Note (Signed)
 Elink monitoring for the code sepsis protocol.

## 2024-01-21 ENCOUNTER — Inpatient Hospital Stay (HOSPITAL_COMMUNITY): Payer: MEDICAID

## 2024-01-21 ENCOUNTER — Encounter (HOSPITAL_COMMUNITY): Payer: Self-pay | Admitting: Family Medicine

## 2024-01-21 ENCOUNTER — Inpatient Hospital Stay (HOSPITAL_COMMUNITY): Payer: MEDICAID | Admitting: Anesthesiology

## 2024-01-21 ENCOUNTER — Encounter (HOSPITAL_COMMUNITY)
Admission: EM | Disposition: A | Discharge status: Home / Self Care | Payer: Self-pay | Source: Home / Self Care | Attending: Family Medicine

## 2024-01-21 DIAGNOSIS — I10 Essential (primary) hypertension: Secondary | ICD-10-CM

## 2024-01-21 DIAGNOSIS — K810 Acute cholecystitis: Secondary | ICD-10-CM

## 2024-01-21 HISTORY — PX: CHOLECYSTECTOMY: SHX55

## 2024-01-21 LAB — COMPREHENSIVE METABOLIC PANEL WITH GFR
ALT: 21 U/L (ref 0–44)
AST: 17 U/L (ref 15–41)
Albumin: 2.8 g/dL — ABNORMAL LOW (ref 3.5–5.0)
Alkaline Phosphatase: 51 U/L (ref 38–126)
Anion gap: 12 (ref 5–15)
BUN: 8 mg/dL (ref 6–20)
CO2: 25 mmol/L (ref 22–32)
Calcium: 8.3 mg/dL — ABNORMAL LOW (ref 8.9–10.3)
Chloride: 101 mmol/L (ref 98–111)
Creatinine, Ser: 0.62 mg/dL (ref 0.44–1.00)
GFR, Estimated: >60 mL/min (ref >60)
Glucose, Bld: 99 mg/dL (ref 70–99)
Potassium: 3 mmol/L — ABNORMAL LOW (ref 3.5–5.1)
Sodium: 138 mmol/L (ref 135–145)
Total Bilirubin: 1.2 mg/dL (ref 0.0–1.2)
Total Protein: 7.1 g/dL (ref 6.5–8.1)

## 2024-01-21 LAB — CBC
HCT: 37.1 % (ref 36.0–46.0)
Hemoglobin: 12.4 g/dL (ref 12.0–15.0)
MCH: 29.9 pg (ref 26.0–34.0)
MCHC: 33.4 g/dL (ref 30.0–36.0)
MCV: 89.4 fL (ref 80.0–100.0)
Platelets: 270 K/uL (ref 150–400)
RBC: 4.15 MIL/uL (ref 3.87–5.11)
RDW: 13.5 % (ref 11.5–15.5)
WBC: 12.7 K/uL — ABNORMAL HIGH (ref 4.0–10.5)
nRBC: 0 % (ref 0.0–0.2)

## 2024-01-21 LAB — MAGNESIUM: Magnesium: 1.6 mg/dL — ABNORMAL LOW (ref 1.7–2.4)

## 2024-01-21 LAB — SURGICAL PCR SCREEN
MRSA, PCR: NEGATIVE
Staphylococcus aureus: POSITIVE — AB

## 2024-01-21 LAB — GLUCOSE, CAPILLARY
Glucose-Capillary: 191 mg/dL — ABNORMAL HIGH (ref 70–99)
Glucose-Capillary: 171 mg/dL — ABNORMAL HIGH (ref 70–99)
Glucose-Capillary: 153 mg/dL — ABNORMAL HIGH (ref 70–99)
Glucose-Capillary: 191 mg/dL — ABNORMAL HIGH (ref 70–99)
Glucose-Capillary: 250 mg/dL — ABNORMAL HIGH (ref 70–99)

## 2024-01-21 LAB — HIV ANTIBODY (ROUTINE TESTING W REFLEX): HIV Screen 4th Generation wRfx: NONREACTIVE

## 2024-01-21 LAB — PHOSPHORUS: Phosphorus: 3.5 mg/dL (ref 2.5–4.6)

## 2024-01-21 SURGERY — LAPAROSCOPIC CHOLECYSTECTOMY
Anesthesia: General

## 2024-01-21 MED ORDER — HYDROMORPHONE HCL 1 MG/ML IJ SOLN
INTRAMUSCULAR | Status: DC | PRN
  Administered 2024-01-21: .5 mg via INTRAVENOUS

## 2024-01-21 MED ORDER — MUPIROCIN 2 % EX OINT
1.0000 | TOPICAL_OINTMENT | Freq: Two times a day (BID) | CUTANEOUS | Status: DC
  Administered 2024-01-21 – 2024-01-22 (×2): 1 via NASAL
  Filled 2024-01-21: qty 22

## 2024-01-21 MED ORDER — HYDROMORPHONE HCL 1 MG/ML IJ SOLN
INTRAMUSCULAR | Status: AC
  Filled 2024-01-21: qty 0.5

## 2024-01-21 MED ORDER — INDOCYANINE GREEN 25 MG IV SOLR: 1.2500 mg | INTRAVENOUS | Status: AC

## 2024-01-21 MED ORDER — FENTANYL CITRATE (PF) 250 MCG/5ML IJ SOLN
INTRAMUSCULAR | Status: AC
  Filled 2024-01-21: qty 5

## 2024-01-21 MED ORDER — PROPOFOL 10 MG/ML IV BOLUS
INTRAVENOUS | Status: DC | PRN
  Administered 2024-01-21: 150 mg via INTRAVENOUS

## 2024-01-21 MED ORDER — LACTATED RINGERS IV SOLN: INTRAVENOUS | Status: DC

## 2024-01-21 MED ORDER — AMISULPRIDE (ANTIEMETIC) 5 MG/2ML IV SOLN: 10.0000 mg | Freq: Once | INTRAVENOUS | Status: DC | PRN

## 2024-01-21 MED ORDER — SPY AGENT GREEN - (INDOCYANINE FOR INJECTION)
1.2500 mg | Freq: Once | INTRAMUSCULAR | Status: AC
  Administered 2024-01-21: 1.25 mg via INTRAVENOUS

## 2024-01-21 MED ORDER — BUPIVACAINE-EPINEPHRINE 0.25% -1:200000 IJ SOLN
INTRAMUSCULAR | Status: DC | PRN
  Administered 2024-01-21: 8 mL

## 2024-01-21 MED ORDER — LACTATED RINGERS IV SOLN: INTRAVENOUS | Status: DC | PRN

## 2024-01-21 MED ORDER — POTASSIUM CHLORIDE 10 MEQ/100ML IV SOLN
10.0000 meq | INTRAVENOUS | Status: AC
  Administered 2024-01-21 (×2): 10 meq via INTRAVENOUS
  Filled 2024-01-21 (×2): qty 100

## 2024-01-21 MED ORDER — FENTANYL CITRATE (PF) 100 MCG/2ML IJ SOLN: 25.0000 ug | INTRAMUSCULAR | Status: DC | PRN

## 2024-01-21 MED ORDER — DEXMEDETOMIDINE HCL IN NACL 80 MCG/20ML IV SOLN
INTRAVENOUS | Status: DC | PRN
  Administered 2024-01-21: 10 ug via INTRAVENOUS

## 2024-01-21 MED ORDER — ORAL CARE MOUTH RINSE: 15.0000 mL | Freq: Once | OROMUCOSAL | Status: AC

## 2024-01-21 MED ORDER — OXYCODONE HCL 5 MG PO TABS: 5.0000 mg | ORAL_TABLET | Freq: Once | ORAL | Status: DC | PRN

## 2024-01-21 MED ORDER — POTASSIUM CHLORIDE 10 MEQ/100ML IV SOLN
10.0000 meq | Freq: Once | INTRAVENOUS | Status: AC
  Administered 2024-01-21: 10 meq via INTRAVENOUS
  Filled 2024-01-21: qty 100

## 2024-01-21 MED ORDER — SUGAMMADEX SODIUM 200 MG/2ML IV SOLN
INTRAVENOUS | Status: DC | PRN
  Administered 2024-01-21: 200 mg via INTRAVENOUS

## 2024-01-21 MED ORDER — FENTANYL CITRATE (PF) 250 MCG/5ML IJ SOLN
INTRAMUSCULAR | Status: DC | PRN
  Administered 2024-01-21 (×2): 50 ug via INTRAVENOUS
  Administered 2024-01-21: 100 ug via INTRAVENOUS

## 2024-01-21 MED ORDER — ONDANSETRON HCL 4 MG/2ML IJ SOLN
INTRAMUSCULAR | Status: DC | PRN
  Administered 2024-01-21: 4 mg via INTRAVENOUS

## 2024-01-21 MED ORDER — MAGNESIUM SULFATE 2 GM/50ML IV SOLN
2.0000 g | Freq: Once | INTRAVENOUS | Status: AC
  Administered 2024-01-21: 2 g via INTRAVENOUS
  Filled 2024-01-21: qty 50

## 2024-01-21 MED ORDER — ROCURONIUM BROMIDE 10 MG/ML (PF) SYRINGE
PREFILLED_SYRINGE | INTRAVENOUS | Status: DC | PRN
  Administered 2024-01-21: 50 mg via INTRAVENOUS

## 2024-01-21 MED ORDER — DEXAMETHASONE SOD PHOSPHATE PF 10 MG/ML IJ SOLN
INTRAMUSCULAR | Status: DC | PRN
  Administered 2024-01-21: 5 mg via INTRAVENOUS

## 2024-01-21 MED ORDER — INSULIN ASPART 100 UNIT/ML IJ SOLN
1.0000 [IU] | Freq: Once | INTRAMUSCULAR | Status: AC
  Administered 2024-01-21: 1 [IU] via SUBCUTANEOUS
  Filled 2024-01-21: qty 1

## 2024-01-21 MED ORDER — CHLORHEXIDINE GLUCONATE CLOTH 2 % EX PADS
6.0000 | MEDICATED_PAD | Freq: Every day | CUTANEOUS | Status: DC
  Administered 2024-01-21 – 2024-01-22 (×2): 6 via TOPICAL

## 2024-01-21 MED ORDER — MIDAZOLAM HCL 2 MG/2ML IJ SOLN
INTRAMUSCULAR | Status: AC
  Filled 2024-01-21: qty 2

## 2024-01-21 MED ORDER — OXYCODONE HCL 5 MG/5ML PO SOLN: 5.0000 mg | Freq: Once | ORAL | Status: DC | PRN

## 2024-01-21 MED ORDER — LIDOCAINE 2% (20 MG/ML) 5 ML SYRINGE
INTRAMUSCULAR | Status: DC | PRN
  Administered 2024-01-21: 60 mg via INTRAVENOUS

## 2024-01-21 MED ORDER — CHLORHEXIDINE GLUCONATE 0.12 % MT SOLN
15.0000 mL | Freq: Once | OROMUCOSAL | Status: AC
  Administered 2024-01-21: 15 mL via OROMUCOSAL

## 2024-01-21 SURGICAL SUPPLY — 38 items
BAG COUNTER SPONGE SURGICOUNT (BAG) ×2 IMPLANT
BLADE CLIPPER SURG (BLADE) IMPLANT
CANISTER SUCTION 3000ML PPV (SUCTIONS) ×2 IMPLANT
CHLORAPREP W/TINT 26 (MISCELLANEOUS) ×2 IMPLANT
CLIP APPLIE 5 13 M/L LIGAMAX5 (MISCELLANEOUS) ×2 IMPLANT
COVER MAYO STAND STRL (DRAPES) IMPLANT
COVER SURGICAL LIGHT HANDLE (MISCELLANEOUS) ×2 IMPLANT
DERMABOND ADVANCED .7 DNX12 (GAUZE/BANDAGES/DRESSINGS) ×2 IMPLANT
DRAPE C-ARM 42X120 X-RAY (DRAPES) IMPLANT
ELECTRODE REM PT RTRN 9FT ADLT (ELECTROSURGICAL) ×2 IMPLANT
GLOVE BIO SURGEON STRL SZ7 (GLOVE) ×2 IMPLANT
GLOVE BIOGEL PI IND STRL 7.5 (GLOVE) ×2 IMPLANT
GOWN STRL REUS W/ TWL LRG LVL3 (GOWN DISPOSABLE) ×6 IMPLANT
GRASPER SUT TROCAR 14GX15 (MISCELLANEOUS) ×2 IMPLANT
IRRIGATION SUCT STRKRFLW 2 WTP (MISCELLANEOUS) ×2 IMPLANT
KIT BASIN OR (CUSTOM PROCEDURE TRAY) ×2 IMPLANT
KIT IMAGING PINPOINTPAQ (MISCELLANEOUS) IMPLANT
KIT TURNOVER KIT B (KITS) ×2 IMPLANT
PAD ARMBOARD POSITIONER FOAM (MISCELLANEOUS) ×2 IMPLANT
POUCH RETRIEVAL ECOSAC 10 (ENDOMECHANICALS) ×2 IMPLANT
SCISSORS LAP 5X35 DISP (ENDOMECHANICALS) ×2 IMPLANT
SET CHOLANGIOGRAPH 5 50 .035 (SET/KITS/TRAYS/PACK) IMPLANT
SET TUBE SMOKE EVAC HIGH FLOW (TUBING) ×2 IMPLANT
SLEEVE Z-THREAD 5X100MM (TROCAR) ×4 IMPLANT
SOLN 0.9% NACL 1000 ML (IV SOLUTION) ×1 IMPLANT
SOLN 0.9% NACL POUR BTL 1000ML (IV SOLUTION) ×2 IMPLANT
SOLN STERILE WATER 1000 ML (IV SOLUTION) ×1 IMPLANT
SOLN STERILE WATER BTL 1000 ML (IV SOLUTION) ×2 IMPLANT
SPECIMEN JAR SMALL (MISCELLANEOUS) ×2 IMPLANT
STRIP CLOSURE SKIN 1/2X4 (GAUZE/BANDAGES/DRESSINGS) ×2 IMPLANT
SUT MNCRL AB 4-0 PS2 18 (SUTURE) ×2 IMPLANT
SUT VICRYL 0 UR6 27IN ABS (SUTURE) ×2 IMPLANT
TOWEL GREEN STERILE (TOWEL DISPOSABLE) ×2 IMPLANT
TOWEL GREEN STERILE FF (TOWEL DISPOSABLE) ×2 IMPLANT
TRAY LAPAROSCOPIC MC (CUSTOM PROCEDURE TRAY) ×2 IMPLANT
TROCAR BALLN 12MMX100 BLUNT (TROCAR) ×2 IMPLANT
TROCAR Z-THREAD OPTICAL 5X100M (TROCAR) ×2 IMPLANT
WARMER LAPAROSCOPE (MISCELLANEOUS) ×2 IMPLANT

## 2024-01-21 NOTE — Discharge Instructions (Addendum)
[facility]  Please arrive at least 30 min before your appointment to complete your check in paperwork.  If you are unable to arrive 30 min prior to your appointment time we may have to cancel or reschedule you. LAPAROSCOPIC SURGERY: POST OP INSTRUCTIONS Always review your discharge instruction sheet given to you by the facility where your surgery was performed. IF YOU HAVE DISABILITY OR FAMILY LEAVE FORMS, YOU MUST BRING THEM TO THE OFFICE FOR PROCESSING.   DO NOT GIVE THEM TO YOUR DOCTOR.  PAIN CONTROL  First take acetaminophen (Tylenol) AND/or ibuprofen (Advil) to control your pain after surgery.  Follow directions on package.  Taking acetaminophen (Tylenol) and/or ibuprofen (Advil) regularly after surgery will help to control your pain and lower the amount of prescription pain medication you may need.  You should not take more than 4,000 mg (4 grams) of acetaminophen (Tylenol) in 24 hours.  You should not take ibuprofen (Advil), aleve, motrin, naprosyn or other NSAIDS if you have a history of stomach ulcers or chronic kidney disease.  A prescription for pain medication may be given to you upon discharge.  Take your pain medication as prescribed, if you still have uncontrolled pain after taking acetaminophen (Tylenol) or ibuprofen (Advil). Use ice packs to help control pain. If you need a refill on your pain medication, please contact your pharmacy.  They will contact our office to request authorization. Prescriptions will not be filled after 5pm or on week-ends.  HOME MEDICATIONS Take your usually prescribed medications unless otherwise directed.  DIET You should follow a light diet the first few days after arrival home.  Be sure to include lots of fluids daily. Avoid fatty, fried foods.   CONSTIPATION It is common to experience some constipation after surgery and if you are taking pain medication.  Increasing fluid intake and taking a stool softener (such as Colace)  will usually help or prevent this problem from occurring.  A mild laxative (Milk of Magnesia or Miralax) should be taken according to package instructions if there are no bowel movements after 48 hours.  WOUND/INCISION CARE Most patients will experience some swelling and bruising in the area of the incisions.  Ice packs will help.  Swelling and bruising can take several days to resolve.  Unless discharge instructions indicate otherwise, follow guidelines below  STERI-STRIPS - you may remove your outer bandages 48 hours after surgery, and you may shower at that time.  You have steri-strips (small skin tapes) in place directly over the incision.  These strips should be left on the skin for 7-10 days.   DERMABOND/SKIN GLUE - you may shower in 24 hours.  The glue will flake off over the next 2-3 weeks. Any sutures or staples will be removed at the office during your follow-up visit.  ACTIVITIES You may resume regular (light) daily activities beginning the next day--such as daily self-care, walking, climbing stairs--gradually increasing activities as tolerated.  You may have sexual intercourse when it is comfortable.  Refrain from any heavy lifting or straining until approved by your doctor. You may drive when you are no longer taking prescription pain medication, you can comfortably wear a seatbelt, and you can safely maneuver your car and apply brakes.  FOLLOW-UP You should see your doctor in the office for a follow-up appointment approximately 2-3 weeks after your surgery.  You should have been given your post-op/follow-up appointment when your surgery was scheduled.  If you did not receive a post-op/follow-up appointment, make sure   that you call for this appointment within a day or two after you arrive home to insure a convenient appointment time.   WHEN TO CALL YOUR DOCTOR: Fever over 101.0 Inability to urinate Continued bleeding from incision. Increased pain, redness, or drainage from the  incision. Increasing abdominal pain  The clinic staff is available to answer your questions during regular business hours.  Please don't hesitate to call and ask to speak to one of the nurses for clinical concerns.  If you have a medical emergency, go to the nearest emergency room or call 911.  A surgeon from [facility] is always on call at the hospital. [address], [address], [place], [place]  [address] ? [address] [address], , [place]   [address] [phone] ? [phone] ? FAX [phone]    CIRUGIA LAPAROSCOPICA: INSTRUCCIONES DE POST OPERATORIO.  Revise siempre los documentos que le entreguen en el lugar donde se ha hecho la cirugia.  SI USTED NECESITA DOCUMENTOS DE INCAPACIDAD (DISABLE) O DE PERMISO FAMILAR (FAMILY LEAVE) NECESITA TRAERLOS A LA OFICINA PARA QUE SEAN PROCESADOS. NO  SE LOS DE A SU DOCTOR. A su alta del hospital se le dara una receta para controlar el dolor. Tomela como ha sido recetada, si la necesita. Si no la necesita puede tomar, Acetaminofen (Tylenol) o Ibuprofen (Advil) para aliviar dolor moderado. Continue tomando el resto de sus medicinas. Si necesita rellenar la receta, llame a la farmacia. ellos contactan a nuestra oficina pidiendo autorizacion. Este tipo de receta no pueden ser rellenadas despues de las  5pm o durante los fines de semana. Con relacion a la dieta: debe ser ligera los primeros dias despues que llege a la casa. Ejemplo: sopas y galleticas. Tome bastante liquido esos dias. La mayoria de los pacientes padecen de inflamacion y cambio de coloracion de la piel alrededor de las incisiones. esto toma dias en resolver.  pnerse una bolsa de hielo en el area affectada ayuda..  Es comun tambien tener un poco de estrenimiento si esta tomado medicinas para el dolor. incremente la cantidad de liquidos a tomar y puede tomar (Colace) esto previene el problema. Si ya tiene estrenimiento, es decir no ha defecado en 48 horas, puede tomar un  laxativo (Milk of Magnesia or Miralax) uselo como el paquete le explica.  A menos que se le diga algo diferente. Remueva el bendaje a las 24-48 horas despues dela cirugia. y puede banarse en la ducha sin ningun problema. usted puede tener steri-strips (pequenas curitas transparentes en la piel puesta encima de la incision)  Estas banditas strips should be left on the skin for 7-10 days.   Si su cirujano puso pegamento encima de la incision usted puede banarse bajo la ducha en 24 horas. Este pegamento empezara a caerse en las proximas 2-3 semanas. Si le pusieron suturas o presillas (grapos) estos seran quitados en su proxima cita en la oficina. . ACTIVIDADES:  Puede hacer actividad ligera.  Como caminar , subir escaleras y poco a poco irlas incrementando tanto como las tolere. Puede tener relaciones sexuales cuando sea comfortable. No carge objetos pesados o haga esfuerzos que no sean aprovados por su doctor. Puede manejar en cuanto no esta tomando medicamentos fuertes (narcoticos) para el dolor, pueda abrochar confortablemente el cinturon de seguridad, y pueda maniobrar y usar los pedales de su vehiculo con seguridad. PUEDE REGRESAR A TRABAJAR  Debe ver a su doctor para una cita de seguimiento en 2-3 semanas despues de la cirugia.  OTRAS ISNSTRUCCIONES:___________________________________________________________________________________ CUANDO   LLAMAR A SU MEDICO: FIEBRE mayor de  101.0 No produccion de orina. Sangramiento continue de la herida Incremento de dolor, enrojecimientio o drenaje de la herida (incision) Incremento de dolor abdominal.  The clinic staff is available to answer your questions during regular business hours.  Please don't hesitate to call and ask to speak to one of the nurses for clinical concerns.  If you have a medical emergency, go to the nearest emergency room or call 911.  A surgeon from [facility] is always on call at the hospital. [address], [address], [place], [place]  [address] ? [address] [address], [place], [place]   [address] [phone] ? [phone] ? FAX [phone] Web site: [email or web address]  

## 2024-01-21 NOTE — Plan of Care (Signed)

## 2024-01-21 NOTE — Anesthesia Procedure Notes (Signed)
 Procedure Name: Intubation Date/Time: 01/21/2024 12:48 PM  Performed by: Scherrie Mast, CRNAPre-anesthesia Checklist: Patient identified, Emergency Drugs available, Suction available and Patient being monitored Patient Re-evaluated:Patient Re-evaluated prior to induction Oxygen Delivery Method: Circle System Utilized Preoxygenation: Pre-oxygenation with 100% oxygen Induction Type: IV induction Ventilation: Mask ventilation without difficulty Laryngoscope Size: Miller and 2 Grade View: Grade I Tube type: Oral Tube size: 7.0 mm Number of attempts: 1 Airway Equipment and Method: Stylet and Oral airway Placement Confirmation: ETT inserted through vocal cords under direct vision, positive ETCO2 and breath sounds checked- equal and bilateral Tube secured with: Tape Dental Injury: Teeth and Oropharynx as per pre-operative assessment

## 2024-01-21 NOTE — Anesthesia Preprocedure Evaluation (Addendum)
 Anesthesia Evaluation  Patient identified by MRN, date of birth, ID band Patient awake    Reviewed: Allergy & Precautions, NPO status , Patient's Chart, lab work & pertinent test results  Airway Mallampati: II  TM Distance: >3 FB Neck ROM: Full    Dental no notable dental hx. (+) Teeth Intact, Dental Advisory Given   Pulmonary neg pulmonary ROS   Pulmonary exam normal breath sounds clear to auscultation       Cardiovascular hypertension, Pt. on medications Normal cardiovascular exam Rhythm:Regular Rate:Normal  TTE 2022  1. Left ventricular ejection fraction, by estimation, is 55 to 60%. The  left ventricle has normal function. The left ventricle has no regional  wall motion abnormalities. Left ventricular diastolic parameters were  normal.   2. Right ventricular systolic function is normal. The right ventricular  size is normal.   3. The mitral valve is normal in structure. Trivial mitral valve  regurgitation. No evidence of mitral stenosis.   4. The aortic valve is tricuspid. Aortic valve regurgitation is not  visualized. No aortic stenosis is present.   5. The inferior vena cava is normal in size with greater than 50%  respiratory variability, suggesting right atrial pressure of 3 mmHg.     Neuro/Psych negative neurological ROS  negative psych ROS   GI/Hepatic Neg liver ROS,GERD  ,,  Endo/Other  diabetes, Type 2, Oral Hypoglycemic Agents    Renal/GU negative Renal ROS  negative genitourinary   Musculoskeletal negative musculoskeletal ROS (+)    Abdominal   Peds  Hematology negative hematology ROS (+)   Anesthesia Other Findings   Reproductive/Obstetrics                              Anesthesia Physical Anesthesia Plan  ASA: 3  Anesthesia Plan: General   Post-op Pain Management: Tylenol  PO (pre-op)* and Minimal or no pain anticipated   Induction: Intravenous  PONV Risk Score  and Plan: 3 and Midazolam, Dexamethasone and Ondansetron   Airway Management Planned: Oral ETT  Additional Equipment:   Intra-op Plan:   Post-operative Plan: Extubation in OR  Informed Consent: I have reviewed the patients History and Physical, chart, labs and discussed the procedure including the risks, benefits and alternatives for the proposed anesthesia with the patient or authorized representative who has indicated his/her understanding and acceptance.     Dental advisory given  Plan Discussed with: CRNA  Anesthesia Plan Comments:          Anesthesia Quick Evaluation

## 2024-01-21 NOTE — Interval H&P Note (Signed)
 History and Physical Interval Note:  01/21/2024 9:11 AM Have seen and examined patient. Agree with proceeding with lap chole today Stacey Christian  has presented today for surgery, with the diagnosis of cholecystitis.  The various methods of treatment have been discussed with the patient and family. After consideration of risks, benefits and other options for treatment, the patient has consented to  Procedure(s) with comments: LAPAROSCOPIC CHOLECYSTECTOMY (N/A) - LAPROSCOPIC CHOLECYSTECTOMY WITH ICG DYE as a surgical intervention.  The patient's history has been reviewed, patient examined, no change in status, stable for surgery.  I have reviewed the patient's chart and labs.  Questions were answered to the patient's satisfaction.     Donnice Bury

## 2024-01-21 NOTE — Progress Notes (Signed)
 PROGRESS NOTE    Stacey Christian  FMW:[id number] DOB: 1971-02-28 DOA: 01/20/2024 PCP: [name] LABOR, FNP   Brief Narrative:  53 y.o. female with medical history significant of essential hypertension, hyperlipidemia, diabetes mellitus on metformin, morbid obesity presented to the ED with complaints of epigastric abdominal pain that started 5 days ago,  associated with nausea but denies any vomiting. US  Abdomen Shows  Cholelithiasis is noted with moderate gallbladder wall thickening. No sonographic Murphy's sign is noted. If there is clinical concern for cholecystitis, HIDA scan is recommended for further evaluation.  Patient was admitted for further evaluation.  General surgery is consulted.  Assessment & Plan:   Principal Problem:   Acute cholecystitis Active Problems:   HYPERCHOLESTEROLEMIA   OBESITY   HYPERTENSION, BENIGN ESSENTIAL   DM (diabetes mellitus) (HCC)   Epigastric abdominal pain: Probable cholecystitis: Patient presented with intermittent abdominal pain radiating from right quadrant to left quadrant associated with fever and nausea , denies any vomiting. Patient is tachycardic, tachypneic , leucocytosis Met SIRS criteria. Lactic acid 0.9. Ultrasound shows cholelithiasis is noted with moderate gallbladder wall thickening. Continue IV Zosyn for intra-abdominal infection. Continue IV fluid resuscitation.  Continue IV Zofran  as needed for nausea and vomiting. Continue IV morphine  as needed for pain control.   General surgery is consulted.  Patient is scheduled for laparoscopic cholecystectomy today.   Diabetes Mellitus II: He reports taking metformin.  Keep it on hold,  start regular insulin sliding scale   Hyperlipidemia; Will resume statin   Hypertension: Hydralazine as needed Hold hydrochlorothiazide.   Obesity: Diet and exercise discussed in detail.   Hypokalemia: Repleted.  Continue to monitor   DVT prophylaxis: Lovenox Code Status: Full  code Family Communication: No family at bed side Disposition Plan:    Status is: Inpatient Remains inpatient appropriate because:  Patient with improvement laparoscopy cholecystectomy for suspected cholecystitis.    Consultants:  General surgery  Procedures: Scheduled laparoscopic cholecystectomy today Antimicrobials:  Anti-infectives (From admission, onward)    Start     Dose/Rate Route Frequency Ordered Stop   01/20/24 2200  [MAR Hold]  piperacillin-tazobactam (ZOSYN) IVPB 3.375 g        (MAR Hold since Fri 01/21/2024 at 1038.Hold Reason: Transfer to a Procedural area)   3.375 g 12.5 mL/hr over 240 Minutes Intravenous Every 8 hours 01/20/24 1757     01/20/24 1415  cefTRIAXone (ROCEPHIN) 2 g in sodium chloride  0.9 % 100 mL IVPB        2 g 200 mL/hr over 30 Minutes Intravenous  Once 01/20/24 1406 01/20/24 1552      Subjective: Patient was seen and examined at bedside.Overnight events noted. Patient reports feeling much better and reports she is going to have laparoscopic cholecystectomy today.   Reports abdominal pain is improving.  Objective: Vitals:   01/20/24 1846 01/21/24 1015 01/21/24 1041 01/21/24 1400  BP: (!) 158/69 134/60 (!) 167/76 (!) 181/77  Pulse: (!) 102 80 91 87  Resp: 18 16 18 14   Temp: 98.6 F (37 C) 98.5 F (36.9 C) 98.4 F (36.9 C) 98.9 F (37.2 C)  TempSrc:  Oral Oral   SpO2: 99% 98% 97% 92%  Weight:   97.1 kg   Height:   5' 3 (1.6 m)     Intake/Output Summary (Last 24 hours) at 01/21/2024 1418 Last data filed at 01/21/2024 0541 Gross per 24 hour  Intake 998.68 ml  Output --  Net 998.68 ml   Filed Weights   01/20/24 1155 01/21/24 1041  Weight: 97.1 kg 97.1 kg    Examination:  General exam: Appears calm and comfortable, not in any acute distress. Respiratory system: CTA Bilaterally . Respiratory effort normal. RR 16 Cardiovascular system: S1 & S2 heard, RRR. No JVD, murmurs, rubs, gallops or clicks.  Gastrointestinal system: Abdomen  is non distended, soft and non tender.  Normal bowel sounds heard. Central nervous system: Alert and oriented x 3. No focal neurological deficits. Extremities: No edema, no cyanosis, no clubbing. Skin: No rashes, lesions or ulcers Psychiatry: Judgement and insight appear normal. Mood & affect appropriate.    Data Reviewed: I have personally reviewed following labs and imaging studies  CBC: Recent Labs  Lab 01/20/24 1231 01/20/24 1839 01/21/24 0231  WBC 12.6* 13.5* 12.7*  NEUTROABS 8.0*  --   --   HGB 13.2 13.0 12.4  HCT 39.2 38.4 37.1  MCV 89.1 89.1 89.4  PLT 286 272 270   Basic Metabolic Panel: Recent Labs  Lab 01/20/24 1231 01/20/24 1839 01/21/24 0231  NA 137  --  138  K 3.2*  --  3.0*  CL 96*  --  101  CO2 26  --  25  GLUCOSE 164*  --  99  BUN 12  --  8  CREATININE 0.65 0.69 0.62  CALCIUM 9.3  --  8.3*  MG  --   --  1.6*  PHOS  --   --  3.5   GFR: Estimated Creatinine Clearance: 91.3 mL/min (by C-G formula based on SCr of 0.62 mg/dL). Liver Function Tests: Recent Labs  Lab 01/20/24 1231 01/21/24 0231  AST 16 17  ALT 23 21  ALKPHOS 72 51  BILITOT 1.0 1.2  PROT 8.1 7.1  ALBUMIN 4.1 2.8*   Recent Labs  Lab 01/20/24 1231  LIPASE 28   No results for input(s): AMMONIA in the last 168 hours. Coagulation Profile: No results for input(s): INR, PROTIME in the last 168 hours. Cardiac Enzymes: No results for input(s): CKTOTAL, CKMB, CKMBINDEX, TROPONINI in the last 168 hours. BNP (last 3 results) No results for input(s): PROBNP in the last 8760 hours. HbA1C: Recent Labs    01/20/24 1839  HGBA1C 7.5*   CBG: Recent Labs  Lab 01/20/24 1843 01/21/24 0805 01/21/24 1045 01/21/24 1401  GLUCAP 85 191* 171* 153*   Lipid Profile: No results for input(s): CHOL, HDL, LDLCALC, TRIG, CHOLHDL, LDLDIRECT in the last 72 hours. Thyroid Function Tests: No results for input(s): TSH, T4TOTAL, FREET4, T3FREE, THYROIDAB in the  last 72 hours. Anemia Panel: No results for input(s): VITAMINB12, FOLATE, FERRITIN, TIBC, IRON, RETICCTPCT in the last 72 hours. Sepsis Labs: Recent Labs  Lab 01/20/24 1456 01/20/24 1731  LATICACIDVEN 0.9 1.8    Recent Results (from the past 240 hours)  Resp panel by RT-PCR (RSV, Flu A&B, Covid) Anterior Nasal Swab     Status: None   Collection Time: 01/20/24 12:57 PM   Specimen: Anterior Nasal Swab  Result Value Ref Range Status   SARS Coronavirus 2 by RT PCR NEGATIVE NEGATIVE Final    Comment: (NOTE) SARS-CoV-2 target nucleic acids are NOT DETECTED.  The SARS-CoV-2 RNA is generally detectable in upper respiratory specimens during the acute phase of infection. The lowest concentration of SARS-CoV-2 viral copies this assay can detect is 138 copies/mL. A negative result does not preclude SARS-Cov-2 infection and should not be used as the sole basis for treatment or other patient management decisions. A negative result may occur with  improper specimen collection/handling, submission of specimen other  than nasopharyngeal swab, presence of viral mutation(s) within the areas targeted by this assay, and inadequate number of viral copies(<138 copies/mL). A negative result must be combined with clinical observations, patient history, and epidemiological information. The expected result is Negative.  Fact Sheet for Patients:  BloggerCourse.com  Fact Sheet for Healthcare Providers:  SeriousBroker.it  This test is no t yet approved or cleared by the [place]  FDA and  has been authorized for detection and/or diagnosis of SARS-CoV-2 by FDA under an Emergency Use Authorization (EUA). This EUA will remain  in effect (meaning this test can be used) for the duration of the COVID-19 declaration under Section 564(b)(1) of the Act, 21 U.S.C.section 360bbb-3(b)(1), unless the authorization is terminated  or revoked sooner.        Influenza A by PCR NEGATIVE NEGATIVE Final   Influenza B by PCR NEGATIVE NEGATIVE Final    Comment: (NOTE) The Xpert Xpress SARS-CoV-2/FLU/RSV plus assay is intended as an aid in the diagnosis of influenza from Nasopharyngeal swab specimens and should not be used as a sole basis for treatment. Nasal washings and aspirates are unacceptable for Xpert Xpress SARS-CoV-2/FLU/RSV testing.  Fact Sheet for Patients: BloggerCourse.com  Fact Sheet for Healthcare Providers: SeriousBroker.it  This test is not yet approved or cleared by the [place]  FDA and has been authorized for detection and/or diagnosis of SARS-CoV-2 by FDA under an Emergency Use Authorization (EUA). This EUA will remain in effect (meaning this test can be used) for the duration of the COVID-19 declaration under Section 564(b)(1) of the Act, 21 U.S.C. section 360bbb-3(b)(1), unless the authorization is terminated or revoked.     Resp Syncytial Virus by PCR NEGATIVE NEGATIVE Final    Comment: (NOTE) Fact Sheet for Patients: BloggerCourse.com  Fact Sheet for Healthcare Providers: SeriousBroker.it  This test is not yet approved or cleared by the [place]  FDA and has been authorized for detection and/or diagnosis of SARS-CoV-2 by FDA under an Emergency Use Authorization (EUA). This EUA will remain in effect (meaning this test can be used) for the duration of the COVID-19 declaration under Section 564(b)(1) of the Act, 21 U.S.C. section 360bbb-3(b)(1), unless the authorization is terminated or revoked.  Performed at [facility], [address] [place], [place] [address]   Blood culture (routine x 2)     Status: None (Preliminary result)   Collection Time: 01/20/24  2:56 PM   Specimen: BLOOD  Result Value Ref Range Status   Specimen Description   Final    BLOOD LEFT  ANTECUBITAL Performed at [facility], [address], [place], [place] [address]    Special Requests   Final    BOTTLES DRAWN AEROBIC AND ANAEROBIC Blood Culture adequate volume Performed at [facility], [address], [place], [place] [address]    Culture   Final    NO GROWTH < 24 HOURS Performed at [facility], [address], [place], [place] [address]    Report Status PENDING  Incomplete  Surgical pcr screen     Status: Abnormal   Collection Time: 01/21/24  7:35 AM   Specimen: Nasal Mucosa; Nasal Swab  Result Value Ref Range Status   MRSA, PCR NEGATIVE NEGATIVE Final   Staphylococcus aureus POSITIVE (A) NEGATIVE Final    Comment: (NOTE) The Xpert SA Assay (FDA approved for NASAL specimens in patients 27 years of age and older), is one component of a comprehensive surveillance program. It is not intended to diagnose infection nor to  guide or monitor treatment. Performed at [facility], [address] [place], [place] [address]      Radiology Studies: US  Abdomen Limited RUQ (LIVER/GB) Result Date: 01/20/2024 CLINICAL DATA:  Abdominal pain and nausea for 1 week. EXAM: ULTRASOUND ABDOMEN LIMITED RIGHT UPPER QUADRANT COMPARISON:  None Available. FINDINGS: Gallbladder: Cholelithiasis is noted with moderate gallbladder wall thickening measured at 5 mm. No sonographic Murphy's sign is noted. Sludge is noted as well. Common bile duct: Diameter: 5 mm which is within normal limits. Liver: No focal lesion identified. Within normal limits in parenchymal echogenicity. Portal vein is patent on color Doppler imaging with normal direction of blood flow towards the liver. Other: None. IMPRESSION: Cholelithiasis is noted with moderate gallbladder wall thickening. No sonographic Murphy's sign is noted. If there is clinical concern for cholecystitis, HIDA scan is recommended for further evaluation. Electronically Signed   By: Lynwood Landy Raddle M.D.    On: 01/20/2024 14:00   DG Chest Portable 1 View Result Date: 01/20/2024 EXAM: 1 VIEW XRAY OF THE CHEST 01/20/2024 12:48:00 PM COMPARISON: None available. CLINICAL HISTORY: fever. Abdominal pain and nausea x 1 week; hx of anemia, hypertension, diabetes FINDINGS: LUNGS AND PLEURA: No focal pulmonary opacity. No pulmonary edema. No pleural effusion. No pneumothorax. HEART AND MEDIASTINUM: No acute abnormality of the cardiac and mediastinal silhouettes. BONES AND SOFT TISSUES: No acute osseous abnormality. IMPRESSION: 1. No acute process. Electronically signed by: Norleen Boxer MD 01/20/2024 01:42 PM EDT RP Workstation: [id number]    Scheduled Meds:  [MAR Hold] acetaminophen   1,000 mg Oral Q6H   [MAR Hold] docusate sodium   100 mg Oral BID   [MAR Hold] enoxaparin (LOVENOX) injection  40 mg Subcutaneous Q24H   [MAR Hold] insulin aspart  0-6 Units Subcutaneous TID WC   [MAR Hold] potassium chloride  40 mEq Oral Once   Continuous Infusions:  lactated ringers  10 mL/hr at 01/21/24 1103   [MAR Hold] piperacillin-tazobactam (ZOSYN)  IV 3.375 g (01/21/24 0757)     LOS: 1 day    Time spent: 35 mins    Darcel Dawley, MD [facility]   If 7PM-7AM, please contact night-coverage

## 2024-01-21 NOTE — Op Note (Signed)
 Preoperative diagnosis: acute cholecystitis Postoperative diagnosis gangrenous cholecystitis Procedure: Laparoscopic cholecystectomy Surgeon: Dr. Adina Bury Anesthesia: General  Complications: None Drains: None Estimated blood loss: Minimal Specimens: Gallbladder and contents to pathology Sponge count was correct at completion Disposition recovery stable   Indications: 57 yof with ruq pain for five days.  US  shows gallstones, febrile with murphys sign. Discussed proceeding with lap chole.    Procedure: After informed consent was obtained she was taken to the operating room.  She was given antibiotics.  SCDs were placed.  She was placed under anesthesia without complication.  She was prepped and draped in a standard sterile surgical fashion.  A surgical timeout was then performed.   I made a vertical incision below the umbilicus. I incised the fascia sharply and entered the peritoneum bluntly. I placed a 0 vicryl pursestring suture through the fascia and inserted a hasson trocar. The abdomen was insufflated to 15 mm Hg pressure.   then placed 3 additional 5 mm trocars in the epigastrium and right side of the abdomen under direct vision.  I had to aspirate the galbladder to grasp it. She had a gangerous gallbladder.  The gallbladder was then retracted cephalad and lateral.  I was then able to dissect the triangle and clearly obtain a critical view of safety with some difficulty.   I took the gallbladder off the liver bed for some distance just to confirm this. I then divided the artery after placing clips leaving two in place. I then did the same with the cystic duct. I did identify the anatomy with the green dye.  I divided the duct leaving two in place. The clips completely traversed the duct and the duct was viable. The gallbladder was then placed in a retrieval bag.  I obtained hemostasis. I then removed the gallbladder in the retrieval bag.  The umbilical  trocar was removed. I tied down the  pursestring. I closed this additionally with a 0 vicryl using the suture passer device.   I then removed the remaining trocars and desufflated the abdomen.  These were closed with 4-0 Monocryl and glue.  She tolerated this well was extubated and transferred recovery stable

## 2024-01-21 NOTE — Progress Notes (Signed)
 Pt's CBG 171 in pre-op--verbal order to only give 1 unit Novolog instead of following pre-op protocol.

## 2024-01-21 NOTE — Transfer of Care (Signed)
 Immediate Anesthesia Transfer of Care Note  Patient: Stacey Christian  Procedure(s) Performed: LAPAROSCOPIC CHOLECYSTECTOMY  Patient Location: PACU  Anesthesia Type:General  Level of Consciousness: awake, alert , and oriented  Airway & Oxygen Therapy: Patient Spontanous Breathing and Patient connected to nasal cannula oxygen  Post-op Assessment: Report given to RN and Post -op Vital signs reviewed and stable  Post vital signs: Reviewed and stable  Last Vitals:  Vitals Value Taken Time  BP 181/77 01/21/24 14:00  Temp 97 1402  Pulse 86 01/21/24 14:02  Resp 15 01/21/24 14:02  SpO2 94 % 01/21/24 14:02  Vitals shown include unfiled device data.  Last Pain:  Vitals:   01/21/24 1047  TempSrc:   PainSc: 2          Complications: No notable events documented.

## 2024-01-22 ENCOUNTER — Other Ambulatory Visit (HOSPITAL_COMMUNITY): Payer: Self-pay

## 2024-01-22 LAB — BASIC METABOLIC PANEL WITH GFR
Anion gap: 10 (ref 5–15)
BUN: 7 mg/dL (ref 6–20)
CO2: 25 mmol/L (ref 22–32)
Calcium: 8 mg/dL — ABNORMAL LOW (ref 8.9–10.3)
Chloride: 100 mmol/L (ref 98–111)
Creatinine, Ser: 0.61 mg/dL (ref 0.44–1.00)
GFR, Estimated: >60 mL/min (ref >60)
Glucose, Bld: 191 mg/dL — ABNORMAL HIGH (ref 70–99)
Potassium: 3.4 mmol/L — ABNORMAL LOW (ref 3.5–5.1)
Sodium: 135 mmol/L (ref 135–145)

## 2024-01-22 LAB — CBC
HCT: 37.5 % (ref 36.0–46.0)
Hemoglobin: 12.4 g/dL (ref 12.0–15.0)
MCH: 29.9 pg (ref 26.0–34.0)
MCHC: 33.1 g/dL (ref 30.0–36.0)
MCV: 90.4 fL (ref 80.0–100.0)
Platelets: 268 K/uL (ref 150–400)
RBC: 4.15 MIL/uL (ref 3.87–5.11)
RDW: 13.7 % (ref 11.5–15.5)
WBC: 11.8 K/uL — ABNORMAL HIGH (ref 4.0–10.5)
nRBC: 0 % (ref 0.0–0.2)

## 2024-01-22 LAB — MAGNESIUM: Magnesium: 2 mg/dL (ref 1.7–2.4)

## 2024-01-22 LAB — GLUCOSE, CAPILLARY
Glucose-Capillary: 175 mg/dL — ABNORMAL HIGH (ref 70–99)
Glucose-Capillary: 183 mg/dL — ABNORMAL HIGH (ref 70–99)
Glucose-Capillary: 192 mg/dL — ABNORMAL HIGH (ref 70–99)

## 2024-01-22 MED ORDER — ACETAMINOPHEN 500 MG PO TABS: 1000.0000 mg | ORAL_TABLET | Freq: Four times a day (QID) | ORAL | Status: AC | PRN

## 2024-01-22 MED ORDER — TRAMADOL HCL 50 MG PO TABS: 50.0000 mg | ORAL_TABLET | Freq: Four times a day (QID) | ORAL | Status: DC | PRN

## 2024-01-22 MED ORDER — TRAMADOL HCL 50 MG PO TABS
50.0000 mg | ORAL_TABLET | Freq: Four times a day (QID) | ORAL | 0 refills | Status: AC | PRN
  Filled 2024-01-22: qty 15, 4d supply, fill #0

## 2024-01-22 NOTE — Progress Notes (Signed)
 1 Day Post-Op  Subjective: Doing well with good pain control.  Tolerating CLD.  Has voided.  No nausea.  ROS: See above, otherwise other systems negative  Objective: Vital signs in last 24 hours: Temp:  [98.3 F (36.8 C)-98.9 F (37.2 C)] 98.3 F (36.8 C) (10/11 0438) Pulse Rate:  [80-91] 80 (10/11 0438) Resp:  [14-18] 18 (10/10 1953) BP: (125-181)/(60-78) 146/69 (10/11 0438) SpO2:  [91 %-100 %] 98 % (10/11 0438) Weight:  [97.1 kg] 97.1 kg (10/10 1041) Last BM Date : 01/20/24  Intake/Output from previous day: 10/10 0701 - 10/11 0700 In: 349.3 [P.O.:150; IV Piggyback:199.3] Out: -  Intake/Output this shift: No intake/output data recorded.  PE: Abd: soft, appropriately tender, incisions c/d/i  Lab Results:  Recent Labs    01/20/24 1839 01/21/24 0231  WBC 13.5* 12.7*  HGB 13.0 12.4  HCT 38.4 37.1  PLT 272 270   BMET Recent Labs    01/20/24 1231 01/20/24 1839 01/21/24 0231  NA 137  --  138  K 3.2*  --  3.0*  CL 96*  --  101  CO2 26  --  25  GLUCOSE 164*  --  99  BUN 12  --  8  CREATININE 0.65 0.69 0.62  CALCIUM 9.3  --  8.3*   PT/INR No results for input(s): LABPROT, INR in the last 72 hours. CMP     Component Value Date/Time   NA 138 01/21/2024 0231   NA 137 02/06/2021 1014   K 3.0 (L) 01/21/2024 0231   CL 101 01/21/2024 0231   CO2 25 01/21/2024 0231   GLUCOSE 99 01/21/2024 0231   BUN 8 01/21/2024 0231   BUN 14 02/06/2021 1014   CREATININE 0.62 01/21/2024 0231   CALCIUM 8.3 (L) 01/21/2024 0231   PROT 7.1 01/21/2024 0231   PROT 7.5 02/06/2021 1011   ALBUMIN 2.8 (L) 01/21/2024 0231   ALBUMIN 4.3 02/06/2021 1011   AST 17 01/21/2024 0231   ALT 21 01/21/2024 0231   ALKPHOS 51 01/21/2024 0231   BILITOT 1.2 01/21/2024 0231   BILITOT 0.5 02/06/2021 1011   GFRNONAA >60 01/21/2024 0231   GFRAA >90 04/01/2012 1335   Lipase     Component Value Date/Time   LIPASE 28 01/20/2024 1231       Studies/Results: US  Abdomen Limited RUQ  (LIVER/GB) Result Date: 01/20/2024 CLINICAL DATA:  Abdominal pain and nausea for 1 week. EXAM: ULTRASOUND ABDOMEN LIMITED RIGHT UPPER QUADRANT COMPARISON:  None Available. FINDINGS: Gallbladder: Cholelithiasis is noted with moderate gallbladder wall thickening measured at 5 mm. No sonographic Murphy's sign is noted. Sludge is noted as well. Common bile duct: Diameter: 5 mm which is within normal limits. Liver: No focal lesion identified. Within normal limits in parenchymal echogenicity. Portal vein is patent on color Doppler imaging with normal direction of blood flow towards the liver. Other: None. IMPRESSION: Cholelithiasis is noted with moderate gallbladder wall thickening. No sonographic Murphy's sign is noted. If there is clinical concern for cholecystitis, HIDA scan is recommended for further evaluation. Electronically Signed   By: Lynwood Landy Raddle M.D.   On: 01/20/2024 14:00   DG Chest Portable 1 View Result Date: 01/20/2024 EXAM: 1 VIEW XRAY OF THE CHEST 01/20/2024 12:48:00 PM COMPARISON: None available. CLINICAL HISTORY: fever. Abdominal pain and nausea x 1 week; hx of anemia, hypertension, diabetes FINDINGS: LUNGS AND PLEURA: No focal pulmonary opacity. No pulmonary edema. No pleural effusion. No pneumothorax. HEART AND MEDIASTINUM: No acute abnormality of the cardiac  and mediastinal silhouettes. BONES AND SOFT TISSUES: No acute osseous abnormality. IMPRESSION: 1. No acute process. Electronically signed by: Norleen Boxer MD 01/20/2024 01:42 PM EDT RP Workstation: [id number]    Anti-infectives: Anti-infectives (From admission, onward)    Start     Dose/Rate Route Frequency Ordered Stop   01/20/24 2200  piperacillin-tazobactam (ZOSYN) IVPB 3.375 g  Status:  Discontinued        3.375 g 12.5 mL/hr over 240 Minutes Intravenous Every 8 hours 01/20/24 1757 01/22/24 0809   01/20/24 1415  cefTRIAXone (ROCEPHIN) 2 g in sodium chloride  0.9 % 100 mL IVPB        2 g 200 mL/hr over 30 Minutes Intravenous   Once 01/20/24 1406 01/20/24 1552        Assessment/Plan POD 1, s/p lap chole for gangrenous cholecystitis by Dr. Ebbie 10/10 -doing well, tolerating CLD, pain controlled -adv to carb mod diet -no further abx needed -surgically stable to DC home today from our standpoint -d/w primary service   FEN - carb mod VTE - lovenox ID - zosyn, stop   LOS: 2 days    Burnard FORBES Banter , [facility] 01/22/2024, 8:09 AM Please see Amion for pager number during day hours 7:00am-4:30pm or 7:00am -11:30am on weekends

## 2024-01-22 NOTE — Discharge Summary (Signed)
 Physician Discharge Summary  Stacey Christian FMW:[id number] DOB: 10-10-1970 DOA: 01/20/2024  PCP: Stacey Christian LABOR, FNP  Admit date: 01/20/2024  Discharge date: 01/22/2024  Admitted From: Home  Disposition: Home  Recommendations for Outpatient Follow-up:  Follow up with PCP in 1-2 weeks. Please obtain BMP/CBC in one week. Advised to follow-up with general surgery in 1 week. Patient is status post laparoscopic cholecystectomy.  Home Health: None Equipment/Devices: None  Discharge Condition: Stable CODE STATUS: Full code Diet recommendation: Carb Modified diet  Brief Summary/ Hospital Course: This 53 y.o. female with medical history significant of essential hypertension, hyperlipidemia, diabetes mellitus on metformin, morbid obesity presented to the ED with complaints of epigastric abdominal pain that started 5 days ago,  associated with nausea but denies any vomiting. US  Abdomen Shows  Cholelithiasis is noted with moderate gallbladder wall thickening. No sonographic Murphy's sign is noted. If there is clinical concern for cholecystitis, HIDA scan is recommended for further evaluation.  Patient was admitted for further evaluation.  General surgery was consulted.  Patient underwent laparoscopy cholecystectomy tolerated well.  Postoperatively patient was started on clear liquid diet,  tolerated well, advanced to soft diet, also tolerated well, She feels better and wants to be discharged home. General Surgery signed off.  Discharge Diagnoses:  Principal Problem:   Acute cholecystitis Active Problems:   HYPERCHOLESTEROLEMIA   OBESITY   HYPERTENSION, BENIGN ESSENTIAL   DM (diabetes mellitus) (HCC)  Epigastric abdominal pain: Probable cholecystitis: Patient presented with intermittent abdominal pain radiating from right quadrant to left quadrant associated with fever and nausea , denies any vomiting. Patient is tachycardic, tachypneic , leucocytosis Met SIRS criteria. Lactic acid  0.9. Ultrasound shows cholelithiasis is noted with moderate gallbladder wall thickening. Continued IV Zosyn for intra-abdominal infection. Continued IV fluid resuscitation.  Continued IV Zofran  as needed for nausea and vomiting. Continued IV morphine  as needed for pain control.   General surgery is consulted.  Patient is status post laparoscopic cholecystectomy. Postoperatively tolerated diet.  General surgery signed off. Patient is being discharged home.   Diabetes Mellitus II: He reports taking metformin.  Keep it on hold,  start regular insulin sliding scale   Hyperlipidemia; Will resume statin   Hypertension: Hydralazine as needed Hold hydrochlorothiazide.   Obesity: Diet and exercise discussed in detail.   Hypokalemia: Repleted.  Continue to monitor  Discharge Instructions  Discharge Instructions     Call MD for:  difficulty breathing, headache or visual disturbances   Complete by: As directed    Call MD for:  persistant dizziness or light-headedness   Complete by: As directed    Call MD for:  persistant nausea and vomiting   Complete by: As directed    Diet - low sodium heart healthy   Complete by: As directed    Diet Carb Modified   Complete by: As directed    Discharge instructions   Complete by: As directed    Advised to follow-up with primary care physician in 1 week. Advised to follow-up with general surgery in 1 week. Patient is status post laparoscopy cholecystectomy.   Increase activity slowly   Complete by: As directed    No wound care   Complete by: As directed       Allergies as of 01/22/2024       Reactions   Lisinopril Cough        Medication List     TAKE these medications    acetaminophen  500 MG tablet Commonly known as: TYLENOL  Take 2 tablets (1,000  mg total) by mouth every 6 (six) hours as needed.   hydrochlorothiazide 25 MG tablet Commonly known as: HYDRODIURIL Take 25 mg by mouth daily.   losartan 25 MG  tablet Commonly known as: COZAAR Take 25 mg by mouth daily.   metFORMIN 1000 MG tablet Commonly known as: GLUCOPHAGE Take 1,000 mg by mouth 2 (two) times daily with a meal.   metoprolol  tartrate 25 MG tablet Commonly known as: LOPRESSOR  Take 1 tablet (25 mg total) by mouth 2 (two) times daily.   pantoprazole 40 MG tablet Commonly known as: PROTONIX Take 40 mg by mouth daily.   simvastatin 20 MG tablet Commonly known as: ZOCOR SMARTSIG:1 Tablet(s) By Mouth Every Evening   traMADol 50 MG tablet Commonly known as: ULTRAM Tome 1 tableta (50 mg en total) por va oral cada 6 (seis) horas segn sea necesario (dolor). (Take 1 tablet (50 mg total) by mouth every 6 (six) hours as needed (pain).)        Follow-up Information     Maczis, Stacey Gosai, PA-C Follow up on 02/15/2024.   Specialty: General Surgery Why: 10:30am, Arrive 30 minutes prior to your appointment time, Please bring your insurance card and photo ID Contact information: [address] [facility] [place] [address] [facility] [place] [address] [phone]         Stacey Christian LABOR, FNP Follow up in 1 week(s).   Specialty: Family Medicine Contact information: [address] [place] [address] [phone]                Allergies  Allergen Reactions   Lisinopril Cough    Consultations: General Surgery   Procedures/Studies: US  Abdomen Limited RUQ (LIVER/GB) Result Date: 01/20/2024 CLINICAL DATA:  Abdominal pain and nausea for 1 week. EXAM: ULTRASOUND ABDOMEN LIMITED RIGHT UPPER QUADRANT COMPARISON:  None Available. FINDINGS: Gallbladder: Cholelithiasis is noted with moderate gallbladder wall thickening measured at 5 mm. No sonographic Murphy's sign is noted. Sludge is noted as well. Common bile duct: Diameter: 5 mm which is within normal limits. Liver: No focal lesion identified. Within normal limits in parenchymal echogenicity. Portal vein is patent on color Doppler imaging with  normal direction of blood flow towards the liver. Other: None. IMPRESSION: Cholelithiasis is noted with moderate gallbladder wall thickening. No sonographic Murphy's sign is noted. If there is clinical concern for cholecystitis, HIDA scan is recommended for further evaluation. Electronically Signed   By: Lynwood Landy Raddle M.D.   On: 01/20/2024 14:00   DG Chest Portable 1 View Result Date: 01/20/2024 EXAM: 1 VIEW XRAY OF THE CHEST 01/20/2024 12:48:00 PM COMPARISON: None available. CLINICAL HISTORY: fever. Abdominal pain and nausea x 1 week; hx of anemia, hypertension, diabetes FINDINGS: LUNGS AND PLEURA: No focal pulmonary opacity. No pulmonary edema. No pleural effusion. No pneumothorax. HEART AND MEDIASTINUM: No acute abnormality of the cardiac and mediastinal silhouettes. BONES AND SOFT TISSUES: No acute osseous abnormality. IMPRESSION: 1. No acute process. Electronically signed by: Norleen Boxer MD 01/20/2024 01:42 PM EDT RP Workstation: [id number]    Subjective: Patient was seen and examined at bedside.  Overnight events noted. Patient reports feeling much better,  abdominal pain has improved.   She wants to be discharged home and tolerated soft diet.  Discharge Exam: Vitals:   01/22/24 0438 01/22/24 0949  BP: (!) 146/69 133/61  Pulse: 80 96  Resp:  16  Temp: 98.3 F (36.8 C) 98.6 F (37 C)  SpO2: 98% 99%   Vitals:   01/21/24 1749 01/21/24  1953 01/22/24 0438 01/22/24 0949  BP: 125/71 (!) 150/72 (!) 146/69 133/61  Pulse: 81 89 80 96  Resp: 16 18  16   Temp: 98.4 F (36.9 C) 98.8 F (37.1 C) 98.3 F (36.8 C) 98.6 F (37 C)  TempSrc: Oral Oral Oral Oral  SpO2: 96% 99% 98% 99%  Weight:      Height:        General: Pt is alert, awake, not in acute distress Cardiovascular: RRR, S1/S2 +, no rubs, no gallops Respiratory: CTA bilaterally, no wheezing, no rhonchi Abdominal: Soft, NT, ND, bowel sounds + Extremities: no edema, no cyanosis    The results of significant diagnostics  from this hospitalization (including imaging, microbiology, ancillary and laboratory) are listed below for reference.     Microbiology: Recent Results (from the past 240 hours)  Resp panel by RT-PCR (RSV, Flu A&B, Covid) Anterior Nasal Swab     Status: None   Collection Time: 01/20/24 12:57 PM   Specimen: Anterior Nasal Swab  Result Value Ref Range Status   SARS Coronavirus 2 by RT PCR NEGATIVE NEGATIVE Final    Comment: (NOTE) SARS-CoV-2 target nucleic acids are NOT DETECTED.  The SARS-CoV-2 RNA is generally detectable in upper respiratory specimens during the acute phase of infection. The lowest concentration of SARS-CoV-2 viral copies this assay can detect is 138 copies/mL. A negative result does not preclude SARS-Cov-2 infection and should not be used as the sole basis for treatment or other patient management decisions. A negative result may occur with  improper specimen collection/handling, submission of specimen other than nasopharyngeal swab, presence of viral mutation(s) within the areas targeted by this assay, and inadequate number of viral copies(<138 copies/mL). A negative result must be combined with clinical observations, patient history, and epidemiological information. The expected result is Negative.  Fact Sheet for Patients:  BloggerCourse.com  Fact Sheet for Healthcare Providers:  SeriousBroker.it  This test is no t yet approved or cleared by the [place]  FDA and  has been authorized for detection and/or diagnosis of SARS-CoV-2 by FDA under an Emergency Use Authorization (EUA). This EUA will remain  in effect (meaning this test can be used) for the duration of the COVID-19 declaration under Section 564(b)(1) of the Act, 21 U.S.C.section 360bbb-3(b)(1), unless the authorization is terminated  or revoked sooner.       Influenza A by PCR NEGATIVE NEGATIVE Final   Influenza B by PCR NEGATIVE NEGATIVE Final     Comment: (NOTE) The Xpert Xpress SARS-CoV-2/FLU/RSV plus assay is intended as an aid in the diagnosis of influenza from Nasopharyngeal swab specimens and should not be used as a sole basis for treatment. Nasal washings and aspirates are unacceptable for Xpert Xpress SARS-CoV-2/FLU/RSV testing.  Fact Sheet for Patients: BloggerCourse.com  Fact Sheet for Healthcare Providers: SeriousBroker.it  This test is not yet approved or cleared by the [place]  FDA and has been authorized for detection and/or diagnosis of SARS-CoV-2 by FDA under an Emergency Use Authorization (EUA). This EUA will remain in effect (meaning this test can be used) for the duration of the COVID-19 declaration under Section 564(b)(1) of the Act, 21 U.S.C. section 360bbb-3(b)(1), unless the authorization is terminated or revoked.     Resp Syncytial Virus by PCR NEGATIVE NEGATIVE Final    Comment: (NOTE) Fact Sheet for Patients: BloggerCourse.com  Fact Sheet for Healthcare Providers: SeriousBroker.it  This test is not yet approved or cleared by the [place]  FDA and has been authorized for detection and/or diagnosis of  SARS-CoV-2 by FDA under an Emergency Use Authorization (EUA). This EUA will remain in effect (meaning this test can be used) for the duration of the COVID-19 declaration under Section 564(b)(1) of the Act, 21 U.S.C. section 360bbb-3(b)(1), unless the authorization is terminated or revoked.  Performed at [facility], [address] [place], [place] [address]   Blood culture (routine x 2)     Status: None (Preliminary result)   Collection Time: 01/20/24  2:56 PM   Specimen: BLOOD  Result Value Ref Range Status   Specimen Description   Final    BLOOD LEFT ANTECUBITAL Performed at [facility], [address] [place], [place] [address]     Special Requests   Final    BOTTLES DRAWN AEROBIC AND ANAEROBIC Blood Culture adequate volume Performed at [facility], [address], [place], [place] [address]    Culture   Final    NO GROWTH 2 DAYS Performed at [facility], [address] [place], [place] [address]    Report Status PENDING  Incomplete  Surgical pcr screen     Status: Abnormal   Collection Time: 01/21/24  7:35 AM   Specimen: Nasal Mucosa; Nasal Swab  Result Value Ref Range Status   MRSA, PCR NEGATIVE NEGATIVE Final   Staphylococcus aureus POSITIVE (A) NEGATIVE Final    Comment: (NOTE) The Xpert SA Assay (FDA approved for NASAL specimens in patients 42 years of age and older), is one component of a comprehensive surveillance program. It is not intended to diagnose infection nor to guide or monitor treatment. Performed at [facility], [address] [place], [place] [address]      Labs: BNP (last 3 results) No results for input(s): BNP in the last 8760 hours. Basic Metabolic Panel: Recent Labs  Lab 01/20/24 1231 01/20/24 1839 01/21/24 0231 01/22/24 1032  NA 137  --  138 135  K 3.2*  --  3.0* 3.4*  CL 96*  --  101 100  CO2 26  --  25 25  GLUCOSE 164*  --  99 191*  BUN 12  --  8 7  CREATININE 0.65 0.69 0.62 0.61  CALCIUM 9.3  --  8.3* 8.0*  MG  --   --  1.6* 2.0  PHOS  --   --  3.5  --    Liver Function Tests: Recent Labs  Lab 01/20/24 1231 01/21/24 0231  AST 16 17  ALT 23 21  ALKPHOS 72 51  BILITOT 1.0 1.2  PROT 8.1 7.1  ALBUMIN 4.1 2.8*   Recent Labs  Lab 01/20/24 1231  LIPASE 28   No results for input(s): AMMONIA in the last 168 hours. CBC: Recent Labs  Lab 01/20/24 1231 01/20/24 1839 01/21/24 0231 01/22/24 1032  WBC 12.6* 13.5* 12.7* 11.8*  NEUTROABS 8.0*  --   --   --   HGB 13.2 13.0 12.4 12.4  HCT 39.2 38.4 37.1 37.5  MCV 89.1 89.1 89.4 90.4  PLT 286 272 270 268   Cardiac Enzymes: No results for input(s): CKTOTAL, CKMB,  CKMBINDEX, TROPONINI in the last 168 hours. BNP: Invalid input(s): POCBNP CBG: Recent Labs  Lab 01/21/24 1653 01/21/24 2225 01/22/24 0446 01/22/24 0800 01/22/24 1207  GLUCAP 191* 250* 175* 183* 192*   D-Dimer No results for input(s): DDIMER in the last 72 hours. Hgb A1c Recent Labs    01/20/24 1839  HGBA1C 7.5*   Lipid Profile No results for input(s): CHOL, HDL, LDLCALC,  TRIG, CHOLHDL, LDLDIRECT in the last 72 hours. Thyroid function studies No results for input(s): TSH, T4TOTAL, T3FREE, THYROIDAB in the last 72 hours.  Invalid input(s): FREET3 Anemia work up No results for input(s): VITAMINB12, FOLATE, FERRITIN, TIBC, IRON, RETICCTPCT in the last 72 hours. Urinalysis    Component Value Date/Time   COLORURINE YELLOW 01/20/2024 1231   APPEARANCEUR HAZY (A) 01/20/2024 1231   LABSPEC 1.008 01/20/2024 1231   PHURINE 6.5 01/20/2024 1231   GLUCOSEU NEGATIVE 01/20/2024 1231   HGBUR NEGATIVE 01/20/2024 1231   HGBUR trace-lysed 07/31/2009 0956   BILIRUBINUR NEGATIVE 01/20/2024 1231   KETONESUR NEGATIVE 01/20/2024 1231   PROTEINUR NEGATIVE 01/20/2024 1231   UROBILINOGEN 0.2 09/13/2011 2015   NITRITE NEGATIVE 01/20/2024 1231   LEUKOCYTESUR LARGE (A) 01/20/2024 1231   Sepsis Labs Recent Labs  Lab 01/20/24 1231 01/20/24 1839 01/21/24 0231 01/22/24 1032  WBC 12.6* 13.5* 12.7* 11.8*   Microbiology Recent Results (from the past 240 hours)  Resp panel by RT-PCR (RSV, Flu A&B, Covid) Anterior Nasal Swab     Status: None   Collection Time: 01/20/24 12:57 PM   Specimen: Anterior Nasal Swab  Result Value Ref Range Status   SARS Coronavirus 2 by RT PCR NEGATIVE NEGATIVE Final    Comment: (NOTE) SARS-CoV-2 target nucleic acids are NOT DETECTED.  The SARS-CoV-2 RNA is generally detectable in upper respiratory specimens during the acute phase of infection. The lowest concentration of SARS-CoV-2 viral copies this assay can detect  is 138 copies/mL. A negative result does not preclude SARS-Cov-2 infection and should not be used as the sole basis for treatment or other patient management decisions. A negative result may occur with  improper specimen collection/handling, submission of specimen other than nasopharyngeal swab, presence of viral mutation(s) within the areas targeted by this assay, and inadequate number of viral copies(<138 copies/mL). A negative result must be combined with clinical observations, patient history, and epidemiological information. The expected result is Negative.  Fact Sheet for Patients:  BloggerCourse.com  Fact Sheet for Healthcare Providers:  SeriousBroker.it  This test is no t yet approved or cleared by the [place]  FDA and  has been authorized for detection and/or diagnosis of SARS-CoV-2 by FDA under an Emergency Use Authorization (EUA). This EUA will remain  in effect (meaning this test can be used) for the duration of the COVID-19 declaration under Section 564(b)(1) of the Act, 21 U.S.C.section 360bbb-3(b)(1), unless the authorization is terminated  or revoked sooner.       Influenza A by PCR NEGATIVE NEGATIVE Final   Influenza B by PCR NEGATIVE NEGATIVE Final    Comment: (NOTE) The Xpert Xpress SARS-CoV-2/FLU/RSV plus assay is intended as an aid in the diagnosis of influenza from Nasopharyngeal swab specimens and should not be used as a sole basis for treatment. Nasal washings and aspirates are unacceptable for Xpert Xpress SARS-CoV-2/FLU/RSV testing.  Fact Sheet for Patients: BloggerCourse.com  Fact Sheet for Healthcare Providers: SeriousBroker.it  This test is not yet approved or cleared by the [place]  FDA and has been authorized for detection and/or diagnosis of SARS-CoV-2 by FDA under an Emergency Use Authorization (EUA). This EUA will remain in effect  (meaning this test can be used) for the duration of the COVID-19 declaration under Section 564(b)(1) of the Act, 21 U.S.C. section 360bbb-3(b)(1), unless the authorization is terminated or revoked.     Resp Syncytial Virus by PCR NEGATIVE NEGATIVE Final    Comment: (NOTE) Fact Sheet for Patients: BloggerCourse.com  Fact Sheet for Healthcare  Providers: SeriousBroker.it  This test is not yet approved or cleared by the [place]  FDA and has been authorized for detection and/or diagnosis of SARS-CoV-2 by FDA under an Emergency Use Authorization (EUA). This EUA will remain in effect (meaning this test can be used) for the duration of the COVID-19 declaration under Section 564(b)(1) of the Act, 21 U.S.C. section 360bbb-3(b)(1), unless the authorization is terminated or revoked.  Performed at [facility], [address] [place], [place] [address]   Blood culture (routine x 2)     Status: None (Preliminary result)   Collection Time: 01/20/24  2:56 PM   Specimen: BLOOD  Result Value Ref Range Status   Specimen Description   Final    BLOOD LEFT ANTECUBITAL Performed at [facility], [address] [place], [place] [address]    Special Requests   Final    BOTTLES DRAWN AEROBIC AND ANAEROBIC Blood Culture adequate volume Performed at [facility], [address], [place], [place] [address]    Culture   Final    NO GROWTH 2 DAYS Performed at [facility], [address], [place], [place] [address]    Report Status PENDING  Incomplete  Surgical pcr screen     Status: Abnormal   Collection Time: 01/21/24  7:35 AM   Specimen: Nasal Mucosa; Nasal Swab  Result Value Ref Range Status   MRSA, PCR NEGATIVE NEGATIVE Final   Staphylococcus aureus POSITIVE (A) NEGATIVE Final    Comment: (NOTE) The Xpert SA Assay (FDA approved for NASAL specimens in patients 22 years of  age and older), is one component of a comprehensive surveillance program. It is not intended to diagnose infection nor to guide or monitor treatment. Performed at [facility], [address], [place], [place] [address]      Time coordinating discharge: Over 30 minutes  SIGNED:   Darcel Dawley, MD  [facility] 01/22/2024, 12:14 PM Pager   If 7PM-7AM, please contact night-coverage

## 2024-01-22 NOTE — Progress Notes (Signed)
 PT to be discharged Home per MD order. Discussed prescriptions and follow up appointments with the patient. Prescriptions given to patient; medication list explained in detail. Patient verbalized understanding.   Skin clean, dry and intact without evidence of skin break down, no evidence of skin tears noted. IV catheter discontinued intact. Site without signs and symptoms of complications. Pt denies pain at the site currently. No complaints noted.  An After Visit Summary (AVS) was printed and given to the patient. Discharged home with daughter via private auto.

## 2024-01-22 NOTE — Plan of Care (Signed)

## 2024-01-22 NOTE — Plan of Care (Signed)

## 2024-01-23 NOTE — Anesthesia Postprocedure Evaluation (Signed)
 Anesthesia Post Note  Patient: Stacey Christian  Procedure(s) Performed: LAPAROSCOPIC CHOLECYSTECTOMY     Patient location during evaluation: PACU Anesthesia Type: General Level of consciousness: awake and alert Pain management: pain level controlled Vital Signs Assessment: post-procedure vital signs reviewed and stable Respiratory status: spontaneous breathing, nonlabored ventilation, respiratory function stable and patient connected to nasal cannula oxygen Cardiovascular status: blood pressure returned to baseline and stable Postop Assessment: no apparent nausea or vomiting Anesthetic complications: no   There were no known notable events for this encounter.  Last Vitals:  Vitals:   01/22/24 0438 01/22/24 0949  BP: (!) 146/69 133/61  Pulse: 80 96  Resp:  16  Temp: 36.8 C 37 C  SpO2: 98% 99%    Last Pain:  Vitals:   01/22/24 0949  TempSrc: Oral  PainSc:                  Rotunda Worden L Zyanya Glaza

## 2024-01-24 ENCOUNTER — Encounter (HOSPITAL_COMMUNITY): Payer: Self-pay | Admitting: General Surgery

## 2024-01-24 LAB — SURGICAL PATHOLOGY

## 2024-01-25 LAB — CULTURE, BLOOD (ROUTINE X 2)
Culture: NO GROWTH
Special Requests: ADEQUATE

## 2024-02-14 ENCOUNTER — Encounter: Payer: Self-pay | Admitting: Radiology
